# Patient Record
Sex: Male | Born: 1950
Health system: Southern US, Community
[De-identification: ages and names within clinical notes are randomized; demographics above are authoritative.]

## PROBLEM LIST (undated history)

## (undated) DIAGNOSIS — R519 Headache, unspecified: Secondary | ICD-10-CM

## (undated) DIAGNOSIS — F32A Depression, unspecified: Secondary | ICD-10-CM

## (undated) DIAGNOSIS — K219 Gastro-esophageal reflux disease without esophagitis: Secondary | ICD-10-CM

## (undated) DIAGNOSIS — I10 Essential (primary) hypertension: Secondary | ICD-10-CM

## (undated) DIAGNOSIS — N200 Calculus of kidney: Secondary | ICD-10-CM

## (undated) DIAGNOSIS — E119 Type 2 diabetes mellitus without complications: Secondary | ICD-10-CM

## (undated) DIAGNOSIS — F329 Major depressive disorder, single episode, unspecified: Secondary | ICD-10-CM

## (undated) DIAGNOSIS — R51 Headache: Secondary | ICD-10-CM

## (undated) DIAGNOSIS — F419 Anxiety disorder, unspecified: Secondary | ICD-10-CM

## (undated) HISTORY — PX: TONSILLECTOMY: SUR1361

## (undated) HISTORY — DX: Gastro-esophageal reflux disease without esophagitis: K21.9

## (undated) HISTORY — PX: STONE EXTRACTION WITH BASKET: SHX5318

## (undated) HISTORY — PX: COLONOSCOPY: SHX174

## (undated) HISTORY — PX: EXTRACORPOREAL SHOCK WAVE LITHOTRIPSY: SHX1557

---
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
## 2003-06-01 ENCOUNTER — Ambulatory Visit (HOSPITAL_COMMUNITY): Admission: RE | Admit: 2003-06-01 | Discharge: 2003-06-01 | Payer: Self-pay | Admitting: Urology

## 2008-09-02 ENCOUNTER — Observation Stay (HOSPITAL_COMMUNITY): Admission: EM | Admit: 2008-09-02 | Discharge: 2008-09-03 | Payer: Self-pay | Admitting: Emergency Medicine

## 2008-10-07 ENCOUNTER — Ambulatory Visit (HOSPITAL_COMMUNITY): Admission: RE | Admit: 2008-10-07 | Discharge: 2008-10-07 | Payer: Self-pay | Admitting: Urology

## 2010-09-22 LAB — BASIC METABOLIC PANEL
BUN: 22 mg/dL (ref 6–23)
CO2: 28 mEq/L (ref 19–32)
Calcium: 9.1 mg/dL (ref 8.4–10.5)
Chloride: 105 mEq/L (ref 96–112)
Creatinine, Ser: 1.43 mg/dL (ref 0.4–1.5)
GFR calc Af Amer: >60 mL/min (ref >60)
GFR calc non Af Amer: 51 mL/min — ABNORMAL LOW (ref >60)
Glucose, Bld: 150 mg/dL — ABNORMAL HIGH (ref 70–99)
Potassium: 3.2 mEq/L — ABNORMAL LOW (ref 3.5–5.1)
Sodium: 143 mEq/L (ref 135–145)

## 2010-09-22 LAB — URINALYSIS, ROUTINE W REFLEX MICROSCOPIC
Bilirubin Urine: NEGATIVE
Glucose, UA: NEGATIVE mg/dL
Ketones, ur: NEGATIVE mg/dL
Leukocytes, UA: NEGATIVE
Nitrite: NEGATIVE
Protein, ur: 30 mg/dL — AB
Specific Gravity, Urine: 1.029 (ref 1.005–1.030)
Urobilinogen, UA: 0.2 mg/dL (ref 0.0–1.0)
pH: 5.5 (ref 5.0–8.0)

## 2010-09-22 LAB — URINE MICROSCOPIC-ADD ON

## 2010-09-22 LAB — CBC
HCT: 45.3 % (ref 39.0–52.0)
Hemoglobin: 15.7 g/dL (ref 13.0–17.0)
MCHC: 34.7 g/dL (ref 30.0–36.0)
MCV: 95.3 fL (ref 78.0–100.0)
Platelets: 193 10*3/uL (ref 150–400)
RBC: 4.75 MIL/uL (ref 4.22–5.81)
RDW: 13.5 % (ref 11.5–15.5)
WBC: 10.1 10*3/uL (ref 4.0–10.5)

## 2010-09-22 LAB — DIFFERENTIAL
Basophils Absolute: 0 10*3/uL (ref 0.0–0.1)
Basophils Relative: 0 % (ref 0–1)
Eosinophils Absolute: 0 10*3/uL (ref 0.0–0.7)
Eosinophils Relative: 0 % (ref 0–5)
Lymphocytes Relative: 14 % (ref 12–46)
Lymphs Abs: 1.4 10*3/uL (ref 0.7–4.0)
Monocytes Absolute: 0.6 10*3/uL (ref 0.1–1.0)
Monocytes Relative: 6 % (ref 3–12)
Neutro Abs: 8 10*3/uL — ABNORMAL HIGH (ref 1.7–7.7)
Neutrophils Relative %: 79 % — ABNORMAL HIGH (ref 43–77)

## 2010-10-25 NOTE — Op Note (Signed)
[name], Reginald Adkins                ACCOUNT NO.:  [id number]   MEDICAL RECORD NO.:  [id number]          PATIENT TYPE:  OBV   LOCATION:  1438                         FACILITY:  [facility]   PHYSICIAN:  Sigmund I. Patsi Sears, M.D.DATE OF BIRTH:  [birth year]   DATE OF PROCEDURE:  09/02/2008  DATE OF DISCHARGE:                               OPERATIVE REPORT   PREOPERATIVE DIAGNOSIS:  Impacted left lower ureteral calculus.   POSTOPERATIVE DIAGNOSIS:  Impacted left lower ureteral calculus.   OPERATIONS:  Cystourethroscopy, left retrograde pyelogram with  interpretation, left ureteroscopy, laser fractionation of left ureteral  calculus, basket extraction of left lower ureteral stone, left double-J  stent (6-French x 24 cm length double-J stent).   SURGEON:  Dr. Patsi Sears   ANESTHESIA:  Spinal.   PREPARATION:  After appropriate preanesthesia, the patient was brought  to the operating room in good condition.  He was placed on the operating  room in the right lateral decubitus position where a spinal anesthetic  was introduced.  He was then replaced in dorsal lithotomy position where  the pubis was prepped with Betadine solution and draped in the usual  fashion.   BRIEF HISTORY:  This 57-year male was evaluated in the emergency room 12  hours ago, for severe left flank and left lower quadrant pain, found to  have impacted stone in the left lower ureter, as well as bilateral  stones.  There was also an abnormality of the left kidney which will  eventually need to be evaluated once the ureter is stone free.  The  patient is now for basket extraction of stone.   DESCRIPTION OF PROCEDURE:  Cystoscopy was accomplished and showed edema  of left ureteral orifice, and a mass effect of the intramural ureter.  Retrograde pyelogram was performed which showed a large stone in the  intramural ureter.  Ureteroscopy was accomplished, and a multifaceted  stone was identified.  Using the 200 laser fiber, the  stone was  fractioned into three parts, and each part was basket extracted.  Following this, a 6-French x 24 cm double-J catheter was placed in the  renal pelvis, and in the bladder.  The string was left on the end of the  double-J stent for ease of removal.  The patient was then given IV  Toradol, and taken to the recovery room in good condition.  signed 09/03/08 @ 4pm      Sigmund I. Patsi Sears, M.D.  Electronically Signed     SIT/MEDQ  D:  09/02/2008  T:  09/02/2008  Job:  [id number]

## 2012-03-18 ENCOUNTER — Telehealth: Payer: Self-pay | Admitting: Internal Medicine

## 2012-03-18 ENCOUNTER — Ambulatory Visit (INDEPENDENT_AMBULATORY_CARE_PROVIDER_SITE_OTHER): Payer: Self-pay | Admitting: Internal Medicine

## 2012-03-18 ENCOUNTER — Encounter: Payer: Self-pay | Admitting: Internal Medicine

## 2012-03-18 VITALS — BP 156/100 | HR 100 | Temp 99.4°F | Ht 70.0 in | Wt 209.0 lb

## 2012-03-18 DIAGNOSIS — E119 Type 2 diabetes mellitus without complications: Secondary | ICD-10-CM

## 2012-03-18 DIAGNOSIS — I10 Essential (primary) hypertension: Secondary | ICD-10-CM

## 2012-03-18 DIAGNOSIS — E1165 Type 2 diabetes mellitus with hyperglycemia: Secondary | ICD-10-CM

## 2012-03-18 DIAGNOSIS — Z87442 Personal history of urinary calculi: Secondary | ICD-10-CM

## 2012-03-18 LAB — COMPREHENSIVE METABOLIC PANEL
ALT: 96 U/L — ABNORMAL HIGH (ref 0–53)
AST: 97 U/L — ABNORMAL HIGH (ref 0–37)
Albumin: 4.3 g/dL (ref 3.5–5.2)
Alkaline Phosphatase: 104 U/L (ref 39–117)
BUN: 19 mg/dL (ref 6–23)
CO2: 25 mEq/L (ref 19–32)
Calcium: 9.4 mg/dL (ref 8.4–10.5)
Chloride: 100 mEq/L (ref 96–112)
Creat: 0.92 mg/dL (ref 0.50–1.35)
Glucose, Bld: 363 mg/dL — ABNORMAL HIGH (ref 70–99)
Potassium: 4.4 mEq/L (ref 3.5–5.3)
Sodium: 135 mEq/L (ref 135–145)
Total Bilirubin: 1.1 mg/dL (ref 0.3–1.2)
Total Protein: 7.3 g/dL (ref 6.0–8.3)

## 2012-03-18 LAB — POCT URINALYSIS DIPSTICK
Bilirubin, UA: NEGATIVE
Blood, UA: NEGATIVE
Glucose, UA: >2000
Leukocytes, UA: NEGATIVE
Nitrite, UA: NEGATIVE
Protein, UA: NEGATIVE
Spec Grav, UA: 1.02
Urobilinogen, UA: NEGATIVE
pH, UA: 6

## 2012-03-18 LAB — CBC WITH DIFFERENTIAL/PLATELET
Basophils Absolute: 0.1 10*3/uL (ref 0.0–0.1)
Basophils Relative: 1 % (ref 0–1)
Eosinophils Absolute: 0.1 10*3/uL (ref 0.0–0.7)
Eosinophils Relative: 1 % (ref 0–5)
HCT: 48 % (ref 39.0–52.0)
Hemoglobin: 16.6 g/dL (ref 13.0–17.0)
Lymphocytes Relative: 17 % (ref 12–46)
Lymphs Abs: 1.7 10*3/uL (ref 0.7–4.0)
MCH: 32.8 pg (ref 26.0–34.0)
MCHC: 34.6 g/dL (ref 30.0–36.0)
MCV: 94.9 fL (ref 78.0–100.0)
Monocytes Absolute: 0.6 10*3/uL (ref 0.1–1.0)
Monocytes Relative: 6 % (ref 3–12)
Neutro Abs: 7.4 10*3/uL (ref 1.7–7.7)
Neutrophils Relative %: 75 % (ref 43–77)
Platelets: 241 10*3/uL (ref 150–400)
RBC: 5.06 MIL/uL (ref 4.22–5.81)
RDW: 12.7 % (ref 11.5–15.5)
WBC: 9.9 10*3/uL (ref 4.0–10.5)

## 2012-03-18 LAB — LIPID PANEL
Cholesterol: 190 mg/dL (ref 0–200)
HDL: 31 mg/dL — ABNORMAL LOW (ref >39)
LDL Cholesterol: 127 mg/dL — ABNORMAL HIGH (ref 0–99)
Total CHOL/HDL Ratio: 6.1 Ratio
Triglycerides: 162 mg/dL — ABNORMAL HIGH (ref <150)
VLDL: 32 mg/dL (ref 0–40)

## 2012-03-18 LAB — HEMOGLOBIN A1C
Hgb A1c MFr Bld: 13.9 % — ABNORMAL HIGH (ref <5.7)
Mean Plasma Glucose: 352 mg/dL — ABNORMAL HIGH (ref <117)

## 2012-03-18 NOTE — Progress Notes (Signed)
  Subjective:    Patient ID: Reginald Adkins, male    DOB: April 05, 1951, 61 y.o.   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 Cassell Smiles regarding hyperglycemia. Patient has not been feeling well. Has had nocturia, weakness, and polyuria. He used to be seen at [facility] by Dr. Louanna Raw who has retired. He hasn't been there in 2-3 years. There he was started on metformin for diabetes which worked well for some time. He's also been on benazepril and atenolol for hypertension. History of kidney stones. Has had lithotripsy and left ureteral stent placed in the past. Remote history of GE reflux. Denies smoking or significant alcohol use. History of phimosis which will require surgical correction. He needs to be medically stable before that.  Social history: He is single. Native of [place], [place]. Is accompanied today by a male friend who is supportive and understands diabetes well. Family history: One brother died at age 46 with history of diabetes. Death was due to either to a stroke or an MI. 2 brothers who are borderline diabetics.   Review of Systems weakness, depression, lethargy, polydipsia, polyuria, nocturia     Objective:   Physical Exam  Vitals reviewed. HENT:  Head: Normocephalic and atraumatic.  Right Ear: External ear normal.  Left Ear: External ear normal.  Mouth/Throat: Oropharynx is clear and moist.  Eyes: Conjunctivae normal are normal. Right eye exhibits no discharge. Left eye exhibits no discharge. No scleral icterus.  Neck: Neck supple. No JVD present. No thyromegaly present.  Cardiovascular: Normal rate and normal heart sounds.   No murmur heard. Pulmonary/Chest: Effort normal and breath sounds normal. He has no wheezes. He has no rales.  Abdominal: Soft. Bowel sounds are normal. He exhibits no distension and no mass. There is no tenderness. There is no rebound and no guarding.  Genitourinary:       Deferred to Dr.  Cassell Smiles  Musculoskeletal: He exhibits no edema.  Lymphadenopathy:    He has no cervical adenopathy.  Neurological: He is alert. He has normal reflexes. No cranial nerve deficit. Coordination normal.  Skin: Skin is warm and dry.  Psychiatric: His behavior is normal. Judgment and thought content normal.       Dysphoric          Assessment & Plan:  Diabetes mellitus- out of control  Ketonuria  Hypertension-not well controlled  Plan: NovoLog 7 units subcutaneous in office today. Is to buy glucose monitor today and check Accu-Cheks a.c. and at bedtime. Family friend who is a diabetic will help him with this. He is to return tomorrow for followup. Lab work is drawn and pending including serum acetone. Needs to eat regularly. Reviewed with him diabetic diet. Avoid sugar-containing drinks, too much fruit, too many starches. He will take 5 units Levemir  at bedtime tonight. Return tomorrow for followup.  Addendum: Accu-Chek 2 hours after eating lunch was slightly over 400. Patient had been given 7 units of NovoLog in the office this morning. Now Accu-Chek is 3 336. Patient is advised take 10 units of NovoLog nail. He is to take 5 units of Levemir flex pain at bedtime. Is to call us with a.m. Accu-Chek tomorrow morning. Serum acetone is negative.

## 2012-03-18 NOTE — Patient Instructions (Addendum)
Purchase Accu-Chek machine and check Accu-Cheks before meals and at bedtime. Return tomorrow for followup. Take 5 units Levimir at bedtime tonight.

## 2012-03-18 NOTE — Telephone Encounter (Signed)
Do accucheck now.

## 2012-03-19 ENCOUNTER — Ambulatory Visit (INDEPENDENT_AMBULATORY_CARE_PROVIDER_SITE_OTHER): Payer: Self-pay | Admitting: Internal Medicine

## 2012-03-19 ENCOUNTER — Encounter: Payer: Self-pay | Admitting: Internal Medicine

## 2012-03-19 VITALS — BP 170/108 | HR 92 | Wt 209.0 lb

## 2012-03-19 DIAGNOSIS — I10 Essential (primary) hypertension: Secondary | ICD-10-CM

## 2012-03-19 DIAGNOSIS — E1165 Type 2 diabetes mellitus with hyperglycemia: Secondary | ICD-10-CM

## 2012-03-19 DIAGNOSIS — E119 Type 2 diabetes mellitus without complications: Secondary | ICD-10-CM

## 2012-03-19 LAB — MICROALBUMIN, URINE: Microalb, Ur: 5.83 mg/dL — ABNORMAL HIGH (ref 0.00–1.89)

## 2012-03-19 LAB — KETONES, QUALITATIVE: Acetone, Bld: NEGATIVE

## 2012-03-19 NOTE — Telephone Encounter (Signed)
10/8 @ 0950..called patient to check in.  Pt advised that he slept the best last night that he has slept in a VERY long time.  Went to bed at 2030 and got up at 0130 to use the restroom.  He then slept until 0600.  On the evening of 10/7, he took 10 units of Novalog and ate dinner around 6p.  He checked sugar 2 hours later and it was 503.  He then used the Levemir pen that Dr. Lenord Fellers provided at 10/7 visit and he went to bed.  When he awoke at 0600, before breakfast, it was 299.  He ate breakfast around 0800, checked it at 0955 and it was 327.  Message relayed to Dr. Lenord Fellers.  Per Dr. Lenord Fellers, pt to take 10 units of Novalog.  10/8 @ 1110..Called patient to advise to take 10 units of Novalog NOW.  Continue to monitor glucose readings and we'll see him in the office at 1600 today.  Pt advised that Tamela Oddi (his friend) will come with him.  Dr. Lenord Fellers spoke with drug rep to get patient handouts for education for this patient as well.  Patient verbalized understanding of the orders given.

## 2012-03-19 NOTE — Progress Notes (Signed)
  Subjective:    Patient ID: [name], male    DOB: May 19, 1951, 61 y.o.   MRN: [id number]  HPI  Follow up  from yesterday's initial visit with new onset diabetes mellitus. Serum acetone was negative. Electrolytes, BUN and creatinine were within normal limits. Had elevated SGOT and SGPT. Was able to get his labs handled by  [place] due to indigent status. Accu-Chek this afternoon is 239. Blood pressure remains elevated. Today it is 170/108. He feels better and had less nocturia last evening. Good deal of time spent doing diabetic education today and yesterday. Patient says he's had influenza vaccine done if drugstore recently. He's checking Accu-Cheks a.c. and at bedtime. Accu-Cheks yesterday: at 5 PM 336, 8:27 PM 503. This morning Accu-Chek 296 at 6 AM, 363 at 1: 45 p.m.    Review of Systems     Objective:   Physical Exam Alert and oriented x3. Skin warm and dry. Chest clear. Cardiac exam regular rate and rhythm normal S1 and S2. Diabetic foot exam: No ulcers. Pulses are normal in feet. No calluses.       Assessment & Plan:  Diabetes mellitus out of control  Hypertension  History of kidney stones  History of phimosis  Plan: Return October 11 for followup. He is on Norvasc 5 mg daily started yesterday for hypertension. Atenolol has been discontinued with his diabetes it may not be a good idea to have him on a beta blocker. May well need to restart benazepril. Increase Levemir to 10 units at bedtime. Samples of NovoLog for sliding scale coverage as follows:  Accu-Chek less than 200-no coverage  Accu-Chek 200-250 -take 5 units NovoLog  Accu-Chek 251-300-take 7 units NovoLog  Accu-Chek 300-350- take 10 units NovoLog  Accu-Chek greater than 350- take 12 units NovoLog  Levemir being increased to 10 units at bedtime.

## 2012-03-20 ENCOUNTER — Telehealth: Payer: Self-pay | Admitting: Internal Medicine

## 2012-03-20 NOTE — Telephone Encounter (Signed)
Pt advised to continue to monitor and bring that information to his appointment on Friday.  Pt understands that he is to call us should his levels go way high or way low for any reason.  Pt states he will also rely on Tamela Oddi (his friend) to assist him during non-office hours.  Pt is very appreciative and voices understanding of instructions.

## 2012-03-20 NOTE — Telephone Encounter (Signed)
Sp w/Dr. Lenord Fellers and made her aware of this info.  Pt will continue to monitor and call us back w/results before 1 today.

## 2012-03-20 NOTE — Telephone Encounter (Signed)
Before breakfast, his level was 288.  He took the 5 units of Novalog.  He'll continue monitoring and call me back before 1pm today with additional results.  Pt seems to be getting a little more calm as he calls with information.  Still anxious, but it seems to be leveling out a little more and he is beginning to read the sliding scale and understand somewhat what is asked of him.

## 2012-03-22 ENCOUNTER — Ambulatory Visit (INDEPENDENT_AMBULATORY_CARE_PROVIDER_SITE_OTHER): Payer: Self-pay | Admitting: Internal Medicine

## 2012-03-22 ENCOUNTER — Encounter: Payer: Self-pay | Admitting: Internal Medicine

## 2012-03-22 VITALS — BP 158/104 | HR 100 | Temp 99.3°F | Wt 208.0 lb

## 2012-03-22 DIAGNOSIS — E119 Type 2 diabetes mellitus without complications: Secondary | ICD-10-CM

## 2012-03-22 DIAGNOSIS — I10 Essential (primary) hypertension: Secondary | ICD-10-CM

## 2012-03-22 DIAGNOSIS — E1165 Type 2 diabetes mellitus with hyperglycemia: Secondary | ICD-10-CM

## 2012-03-22 NOTE — Progress Notes (Signed)
  Subjective:    Patient ID: Reginald Adkins, male    DOB: May 10, 1951, 62 y.o.   MRN: [id number]  HPI In today to followup on hypertension and poorly controlled diabetes mellitus. He's now on Levemir flexpen 10 units  at bedtime and NovoLog sliding scale. Yesterday at one point he is Accu-Chek was 178 which is quite good for him given that it was 283 and 400 on Monday, October 7.    Review of Systems     Objective:   Physical Exam chest clear to auscultation; cardiac exam regular rate and rhythm; extremities without edema        Assessment & Plan:  Hypertension-currently on Norvasc 5 mg daily. If I increase Norvasc to 10 mg daily he is likely to get dependent edema. Previously was on benazepril 40 mg daily. Refill benazepril 40 mg (#30 ) with 3 refills 1 by mouth daily and continue Norvasc 5 mg daily. Return in 2 weeks  Diabetes mellitus-he's asking if he will always need insulin. At this point I think it is entirely possible he will continue to need insulin because he was not controlled on metformin and he was having extreme symptoms with polyuria. He is now on a calorie restricted diet. He's going to increase Levemir to 12 units at bedtime and return in 2 weeks. We will not be rechecking hemoglobin A1c for 6-8 weeks. He was told was today. He was also told that his lab work was done complementary by [facility] because of indigent status.  He should be able to get Pneumovax immunization and Influenza immunization at [facility].

## 2012-03-22 NOTE — Patient Instructions (Addendum)
Increase Levemir to 12 units at bedtime. Continue sliding scale insulin with NovoLog. Return in 2 weeks. Start benazepril 40 mg daily and continue Norvasc 5 mg daily

## 2012-04-05 ENCOUNTER — Ambulatory Visit (INDEPENDENT_AMBULATORY_CARE_PROVIDER_SITE_OTHER): Payer: Self-pay | Admitting: Internal Medicine

## 2012-04-05 ENCOUNTER — Encounter: Payer: Self-pay | Admitting: Internal Medicine

## 2012-04-05 VITALS — BP 150/90 | HR 96 | Temp 98.8°F | Ht 70.0 in | Wt 200.0 lb

## 2012-04-05 DIAGNOSIS — E119 Type 2 diabetes mellitus without complications: Secondary | ICD-10-CM

## 2012-04-05 DIAGNOSIS — I10 Essential (primary) hypertension: Secondary | ICD-10-CM

## 2012-04-05 DIAGNOSIS — Z794 Long term (current) use of insulin: Secondary | ICD-10-CM

## 2012-04-05 MED ORDER — AMLODIPINE BESYLATE 5 MG PO TABS: 5.0000 mg | ORAL_TABLET | Freq: Every day | ORAL | Status: DC

## 2012-04-05 NOTE — Patient Instructions (Addendum)
NovoLog as been discontinued. Levemir  samples provided. Decreased Levemir to 10 units at bedtime. Refill Norvasc 5 mg daily. Patient to watch blood pressure outside of the doctor's office and call if persistently elevated. Otherwise return in 2 months. At that time he'll have hemoglobin A1c

## 2012-04-05 NOTE — Progress Notes (Signed)
  Subjective:    Patient ID: Reginald Adkins, male    DOB: 24-Nov-1950, 61 y.o.   MRN: [id number]  HPI In today for follow up and diabetes and hypertension. Blood pressure is a bit high today. He thinks he has an element of office hypertension. Says he will have blood pressure checked outside the office. Have refilled amlodipine 5 mg daily. Had considered increasing amlodipine to 10 mg daily or adding a diuretic. Will wait and see how blood pressure does have the next few weeks. His glucose control was come down nicely. He is no longer requiring sliding scale NovoLog. He is on Levemir 12 units daily. At times fasting Accu-Chek is his low was in the 70s. I think he might be able to cut back on Levemir to 10 units at bedtime. He is pleased with his progress. He is eating better in following a diabetic diet with help from his lady friend.    Review of Systems     Objective:   Physical Exam neck is supple; chest clear; cardiac exam regular rate and rhythm; extremities without edema. diabetic foot exam is negative.        Assessment & Plan:  Insulin-dependent diabetes-patient will like to get back on oral medications but this may not be possible since he had very high Accu-Cheks and diabetes out of control which came down nicely with NovoLog and Levemir. We'll have to see what time to get back on oral medications.  Hypertension-we will have to monitor closely. Patient will have blood pressure checked outside my office to see if control is better. In the meantime continue with benazepril and amlodipine 5 mg daily  Plan: Return in 2 months at which time she'll need office visit hemoglobin A1c

## 2012-04-06 ENCOUNTER — Encounter: Payer: Self-pay | Admitting: Internal Medicine

## 2012-04-06 NOTE — Patient Instructions (Addendum)
Follow sliding scale as directed with NovoLog. Increase Levemir to 10 units at bedtime. Return October 11. Continue Norvasc 5 mg daily for hypertension

## 2012-04-18 ENCOUNTER — Telehealth: Payer: Self-pay | Admitting: Internal Medicine

## 2012-04-19 NOTE — Telephone Encounter (Signed)
Patient dropped off his "record" keeping of his glucose readings and his latest BP reading as of this a.m.  Left for Dr. Lenord Fellers to review.  Pt has appt already scheduled for follow up.

## 2012-06-10 ENCOUNTER — Ambulatory Visit (INDEPENDENT_AMBULATORY_CARE_PROVIDER_SITE_OTHER): Payer: Self-pay | Admitting: Internal Medicine

## 2012-06-10 ENCOUNTER — Encounter: Payer: Self-pay | Admitting: Internal Medicine

## 2012-06-10 VITALS — BP 156/90 | HR 88 | Temp 99.8°F | Ht 70.0 in | Wt 180.0 lb

## 2012-06-10 DIAGNOSIS — I10 Essential (primary) hypertension: Secondary | ICD-10-CM

## 2012-06-10 DIAGNOSIS — E119 Type 2 diabetes mellitus without complications: Secondary | ICD-10-CM

## 2012-06-10 LAB — HEMOGLOBIN A1C
Hgb A1c MFr Bld: 5.8 % — ABNORMAL HIGH (ref <5.7)
Mean Plasma Glucose: 120 mg/dL — ABNORMAL HIGH (ref <117)

## 2012-06-10 NOTE — Progress Notes (Signed)
  Subjective:    Patient ID: Reginald Adkins, male    DOB: 1950/07/29, 61 y.o.   MRN: [id number]  HPI In for recheck Diabetes mellitus. He is on Levemir 10 units at bedtime. His Accu-Cheks are excellent. They're mostly around the low 100s. His blood pressure is elevated however. He is on Norvasc 5 mg daily and benazepril 40 mg daily. Says he is a bit upset because someone gave away some of his chickens yesterday without his permission. He's abutting carbohydrates and sugar. He's lost 29 pounds since early October. He feels well. Has energy and is less depressed.    Review of Systems     Objective:   Physical Exam Chest clear to auscultation. Cardiac exam regular rate and rhythm. Extremities without edema. Diabetic foot exam no ulcers or calluses.        Assessment & Plan:  Type 2 diabetes mellitus-well controlled-discontinue Levemir & try Januvia samples 100 mg daily. Decrease Accu-Cheks to twice daily before breakfast and supper  Hypertension-likes the blood pressure under  better control  Plan: Discontinue benazepril. Try Cozaar 100 mg daily. Continue Norvasc 5 mg daily. Call with blood pressure readings in 2 weeks. Hemoglobin A1c drawn today.  Return in 4 months for followup of diabetes and hypertension

## 2012-06-10 NOTE — Patient Instructions (Addendum)
Discontinue Levemir. Tried Janumet 100 mg daily. Samples of them provided. Discontinue benazepril. Continue Norvasc 5 mg daily. Start Cozaar 100 mg daily and call with blood pressure checks in 2 weeks. Otherwise return in 4 months.

## 2012-06-20 ENCOUNTER — Telehealth: Payer: Self-pay | Admitting: Internal Medicine

## 2012-06-20 NOTE — Telephone Encounter (Signed)
Spoke w/Carol @[facility] [address]) [phone]; Metformin 500 mg 1 po bid #60 with 3 refills.  Patient is aware of this and will continue to provide updates of his glucose readings.

## 2012-10-08 ENCOUNTER — Other Ambulatory Visit: Payer: Self-pay | Admitting: Internal Medicine

## 2012-10-08 ENCOUNTER — Other Ambulatory Visit: Payer: Self-pay

## 2012-10-08 DIAGNOSIS — E119 Type 2 diabetes mellitus without complications: Secondary | ICD-10-CM

## 2012-10-08 LAB — HEMOGLOBIN A1C
Hgb A1c MFr Bld: 5.3 % (ref <5.7)
Mean Plasma Glucose: 105 mg/dL (ref <117)

## 2012-10-08 MED ORDER — AMLODIPINE BESYLATE 5 MG PO TABS: 5.0000 mg | ORAL_TABLET | Freq: Every day | ORAL | Status: DC

## 2012-10-09 ENCOUNTER — Other Ambulatory Visit: Payer: Self-pay | Admitting: Internal Medicine

## 2012-10-09 ENCOUNTER — Other Ambulatory Visit: Payer: Self-pay

## 2012-10-09 MED ORDER — LOSARTAN POTASSIUM 100 MG PO TABS: 100.0000 mg | ORAL_TABLET | Freq: Every day | ORAL | Status: DC

## 2012-10-10 NOTE — Progress Notes (Signed)
Left message with lab results on cell phone. Has appointment in 3 weeks.

## 2012-10-31 ENCOUNTER — Ambulatory Visit (INDEPENDENT_AMBULATORY_CARE_PROVIDER_SITE_OTHER): Payer: Self-pay | Admitting: Internal Medicine

## 2012-10-31 ENCOUNTER — Encounter: Payer: Self-pay | Admitting: Internal Medicine

## 2012-10-31 VITALS — BP 168/94 | HR 80 | Ht 70.0 in | Wt 159.0 lb

## 2012-10-31 DIAGNOSIS — E119 Type 2 diabetes mellitus without complications: Secondary | ICD-10-CM

## 2012-10-31 MED ORDER — HYDROCHLOROTHIAZIDE 25 MG PO TABS: 25.0000 mg | ORAL_TABLET | Freq: Every day | ORAL | Status: DC

## 2012-10-31 MED ORDER — METFORMIN HCL 500 MG PO TABS: 500.0000 mg | ORAL_TABLET | Freq: Two times a day (BID) | ORAL | Status: DC

## 2012-10-31 NOTE — Progress Notes (Signed)
  Subjective:    Patient ID: Reginald Adkins, male    DOB: 04/29/1951, 62 y.o.   MRN: [id number]  HPI 62 year old white male in today for blood pressure and diabetes check. He has continued to lose weight by watching his diet. He is on metformin 500 mg twice daily and recent hemoglobin A1c was normal. His weight today is 159 pounds and previously in December 1 180 pounds. I do not think he should lose any more weight. Encouraged him perhaps a just a bit more. I don't think he is probably getting enough calories. He continues to do yard work. Blood pressure is high today. Says he is worried about his brother who has been diagnosed with recurrent kidney cancer. This is upsetting for him. However he is on losartan 100 mg daily and amlodipine. I'm going to add HCTZ 25 mg to this regimen today and have him return in 4 weeks for nurse visit, blood pressure check and basic metabolic panel.    Review of Systems     Objective:   Physical Exam Neck is supple without JVD thyromegaly or carotid bruits. Chest clear to auscultation. Cardiac exam regular rate and rhythm normal S1-S2. Extremities without edema. Diabetic foot exam without ulcers or calluses       Assessment & Plan:  Hypertension  Type 2 diabetes mellitus-controlled with medication  Anxiety-related to brothers illness  Plan: Add HCTZ 25 mg daily to losartan and amlodipine. Return in 4 weeks for nurse visit, blood pressure check and basic metabolic panel. He did not qualify for Medicaid unfortunately. He thinking of taking early retirement at age 62 to get Medicare.

## 2012-10-31 NOTE — Patient Instructions (Addendum)
Do not lose any more weight. Add HCTZ 25 mg daily to Losartan. Return in 4 weeks for BP check and basic metabolic panel.

## 2012-11-06 DIAGNOSIS — E119 Type 2 diabetes mellitus without complications: Secondary | ICD-10-CM | POA: Insufficient documentation

## 2012-12-02 ENCOUNTER — Encounter: Payer: Self-pay | Admitting: Internal Medicine

## 2012-12-02 ENCOUNTER — Ambulatory Visit (INDEPENDENT_AMBULATORY_CARE_PROVIDER_SITE_OTHER): Payer: Self-pay | Admitting: Internal Medicine

## 2012-12-02 VITALS — BP 155/84

## 2012-12-02 DIAGNOSIS — I10 Essential (primary) hypertension: Secondary | ICD-10-CM

## 2012-12-02 LAB — BASIC METABOLIC PANEL
BUN: 22 mg/dL (ref 6–23)
CO2: 29 mEq/L (ref 19–32)
Calcium: 9.3 mg/dL (ref 8.4–10.5)
Chloride: 103 mEq/L (ref 96–112)
Creat: 0.85 mg/dL (ref 0.50–1.35)
Glucose, Bld: 132 mg/dL — ABNORMAL HIGH (ref 70–99)
Potassium: 4 mEq/L (ref 3.5–5.3)
Sodium: 142 mEq/L (ref 135–145)

## 2012-12-02 NOTE — Patient Instructions (Addendum)
Combine Losartan and HCTZ into one tablet and return in 3 months. Patient informed. Appointment made for 03/06/2013.

## 2012-12-02 NOTE — Progress Notes (Signed)
  Subjective:    Patient ID: Reginald Adkins, male    DOB: 11-27-1950, 62 y.o.   MRN: [id number]  HPI Patient seen by nurse today for blood pressure check. Blood pressure elevated at 155 systolically. Had recently been placed on losartan 100 mg daily and HCTZ 25 mg daily. This can be combined it to one pill. Says he is getting excellent blood pressure readings at home. Basic metabolic panel drawn today since he is on an ACE inhibitor and HCTZ.    Review of Systems     Objective:   Physical Exam not examined        Assessment & Plan:  Hypertension  Type 2 diabetes mellitus under good control  Plan: He may have an element of office hypertension. He is to continue to keep blood pressure readings at home. Combined losartan with HCTZ 100/25 one tablet daily and return in 3 months

## 2012-12-03 NOTE — Progress Notes (Signed)
Patient informed. Will call back when he is almost out of Losartan so we may change it to Losartan-HCTZ. Will get BP checked at various places and keep [place] posted.

## 2013-01-24 ENCOUNTER — Other Ambulatory Visit: Payer: Self-pay | Admitting: Internal Medicine

## 2013-03-06 ENCOUNTER — Encounter: Payer: Self-pay | Admitting: Internal Medicine

## 2013-03-06 ENCOUNTER — Ambulatory Visit (INDEPENDENT_AMBULATORY_CARE_PROVIDER_SITE_OTHER): Payer: Self-pay | Admitting: Internal Medicine

## 2013-03-06 VITALS — BP 130/80 | HR 80 | Wt 162.0 lb

## 2013-03-06 DIAGNOSIS — E119 Type 2 diabetes mellitus without complications: Secondary | ICD-10-CM

## 2013-03-06 DIAGNOSIS — I10 Essential (primary) hypertension: Secondary | ICD-10-CM

## 2013-03-06 LAB — HEMOGLOBIN A1C
Hgb A1c MFr Bld: 5.6 % (ref <5.7)
Mean Plasma Glucose: 114 mg/dL (ref <117)

## 2013-03-06 NOTE — Addendum Note (Signed)
Addended by: Judy Pimple on: 03/06/2013 12:41 PM   Modules accepted: Orders

## 2013-03-06 NOTE — Progress Notes (Addendum)
  Subjective:    Patient ID: Reginald Adkins, male    DOB: February 26, 1951, 62 y.o.   MRN: [id number]  HPI For follow up of DM and HTN.  Says accuchecks are good. Is on Losartan, amlodipine, and HCTZ. Trying to find low cost health insurance.  [facility] flu vaccine [facility]. Weight is stable.  Thinks he might be eligible for early retirement soon. Qualified for Medicaid but lack of state funding prevented him from getting it he says.    Review of Systems     Objective:   Physical Exam neck supple without carotid bruits. Chest clear to auscultation. Cardiac exam regular rate and rhythm normal S1 and S2. Extremities without edema. Diabetic foot exam without calluses or ulcers        Assessment & Plan:  Controlled type 2 diabetes mellitus  Hypertension-stable on current regimen. Has an element of labile hypertension when he first comes in to doctor's office  Plan: Hemoglobin A1c drawn today. He is self-pay. He [facility] influenza [facility] through [facility] which [facility]. Return in 6 months.

## 2013-03-06 NOTE — Patient Instructions (Addendum)
Continue same medications and return in 6 months. Have flu vaccine at [facility]

## 2013-04-08 ENCOUNTER — Other Ambulatory Visit: Payer: Self-pay | Admitting: Internal Medicine

## 2013-07-04 ENCOUNTER — Other Ambulatory Visit: Payer: Self-pay

## 2013-07-04 MED ORDER — AMLODIPINE BESYLATE 5 MG PO TABS: 5.0000 mg | ORAL_TABLET | Freq: Every day | ORAL | Status: DC

## 2013-09-04 ENCOUNTER — Encounter: Payer: Self-pay | Admitting: Internal Medicine

## 2013-09-04 ENCOUNTER — Ambulatory Visit (INDEPENDENT_AMBULATORY_CARE_PROVIDER_SITE_OTHER): Payer: Self-pay | Admitting: Internal Medicine

## 2013-09-04 VITALS — BP 130/80 | HR 80 | Ht 70.0 in | Wt 160.0 lb

## 2013-09-04 DIAGNOSIS — Z13220 Encounter for screening for lipoid disorders: Secondary | ICD-10-CM

## 2013-09-04 DIAGNOSIS — Z130 Encounter for screening for diseases of the blood and blood-forming organs and certain disorders involving the immune mechanism: Secondary | ICD-10-CM

## 2013-09-04 DIAGNOSIS — I10 Essential (primary) hypertension: Secondary | ICD-10-CM

## 2013-09-04 DIAGNOSIS — Z125 Encounter for screening for malignant neoplasm of prostate: Secondary | ICD-10-CM

## 2013-09-04 DIAGNOSIS — E119 Type 2 diabetes mellitus without complications: Secondary | ICD-10-CM

## 2013-09-04 LAB — CBC WITH DIFFERENTIAL/PLATELET
Basophils Absolute: 0.1 10*3/uL (ref 0.0–0.1)
Basophils Relative: 1 % (ref 0–1)
Eosinophils Absolute: 0.1 10*3/uL (ref 0.0–0.7)
Eosinophils Relative: 2 % (ref 0–5)
HCT: 42.4 % (ref 39.0–52.0)
Hemoglobin: 14.8 g/dL (ref 13.0–17.0)
Lymphocytes Relative: 20 % (ref 12–46)
Lymphs Abs: 1.3 10*3/uL (ref 0.7–4.0)
MCH: 31 pg (ref 26.0–34.0)
MCHC: 34.9 g/dL (ref 30.0–36.0)
MCV: 88.7 fL (ref 78.0–100.0)
Monocytes Absolute: 0.7 10*3/uL (ref 0.1–1.0)
Monocytes Relative: 10 % (ref 3–12)
Neutro Abs: 4.4 10*3/uL (ref 1.7–7.7)
Neutrophils Relative %: 67 % (ref 43–77)
Platelets: 300 10*3/uL (ref 150–400)
RBC: 4.78 MIL/uL (ref 4.22–5.81)
RDW: 12.7 % (ref 11.5–15.5)
WBC: 6.6 10*3/uL (ref 4.0–10.5)

## 2013-09-04 LAB — COMPREHENSIVE METABOLIC PANEL
ALT: 26 U/L (ref 0–53)
AST: 24 U/L (ref 0–37)
Albumin: 4.3 g/dL (ref 3.5–5.2)
Alkaline Phosphatase: 50 U/L (ref 39–117)
BUN: 20 mg/dL (ref 6–23)
CO2: 28 mEq/L (ref 19–32)
Calcium: 9.1 mg/dL (ref 8.4–10.5)
Chloride: 102 mEq/L (ref 96–112)
Creat: 0.86 mg/dL (ref 0.50–1.35)
Glucose, Bld: 106 mg/dL — ABNORMAL HIGH (ref 70–99)
Potassium: 3.9 mEq/L (ref 3.5–5.3)
Sodium: 140 mEq/L (ref 135–145)
Total Bilirubin: 0.9 mg/dL (ref 0.2–1.2)
Total Protein: 6.4 g/dL (ref 6.0–8.3)

## 2013-09-04 LAB — LIPID PANEL
Cholesterol: 93 mg/dL (ref 0–200)
HDL: 27 mg/dL — ABNORMAL LOW (ref >39)
LDL Cholesterol: 59 mg/dL (ref 0–99)
Total CHOL/HDL Ratio: 3.4 Ratio
Triglycerides: 33 mg/dL (ref <150)
VLDL: 7 mg/dL (ref 0–40)

## 2013-09-04 LAB — HEMOGLOBIN A1C
HEMOGLOBIN A1C: 6 % — AB (ref <5.7)
Mean Plasma Glucose: 126 mg/dL — ABNORMAL HIGH (ref <117)

## 2013-09-04 NOTE — Addendum Note (Signed)
Addended by: Brett Canales on: 09/04/2013 11:59 AM   Modules accepted: Orders

## 2013-09-04 NOTE — Patient Instructions (Signed)
Continue same medications and return in 6 months. Continue diet and exercise.

## 2013-09-04 NOTE — Progress Notes (Signed)
   Subjective:    Patient ID: Reginald Adkins, male    DOB: 03-09-1951, 63 y.o.   MRN: [id number]  HPI 63 year old White Male with history of hypertension and type 2 diabetes mellitus well controlled in today for six-month recheck. He's been unable to receive Medicaid. He's been a bit depressed about his finances. He has seasonal employment doing yard work. He tried to sign up for the Affordable Care Act coverage and was told he had to pay $800 a month for coverage. He cannot afford this.  We are able to get his lab work covered for free through [employer]. [place] will give him a significant discount on his medical services. Told him not to worry about this.  His weight is stable. Blood pressure is acceptable. Last night he had some diarrhea for several hours. Says he ate a salad for lunch, ate an egg late last evening and diarrhea started after midnight. Could have been viral or perhaps staphylococcal food poisoning but seems to have dissipated at present time.    Review of Systems     Objective:   Physical Exam  Neck is supple without JVD thyromegaly or carotid bruits. Chest clear to auscultation. Cardiac exam regular rate and rhythm normal S1 and S2. Extremities without edema. Diabetic foot exam without ulcers.      Assessment & Plan:  Type 2 diabetes mellitus-controlled  Hypertension-controlled on current regimen  History of kidney stones  Situational depression regarding finances  Plan: Return in 6 months for office visit. Labs are pending.

## 2013-09-05 LAB — PSA: PSA: 0.18 ng/mL (ref <=4.00)

## 2013-09-05 LAB — MICROALBUMIN, URINE: MICROALB UR: 1.7 mg/dL (ref 0.00–1.89)

## 2013-09-29 ENCOUNTER — Other Ambulatory Visit: Payer: Self-pay | Admitting: Internal Medicine

## 2013-10-28 ENCOUNTER — Other Ambulatory Visit: Payer: Self-pay | Admitting: Internal Medicine

## 2013-10-30 ENCOUNTER — Other Ambulatory Visit: Payer: Self-pay | Admitting: Internal Medicine

## 2013-12-31 ENCOUNTER — Other Ambulatory Visit: Payer: Self-pay

## 2013-12-31 MED ORDER — AMLODIPINE BESYLATE 5 MG PO TABS: 5.0000 mg | ORAL_TABLET | Freq: Every day | ORAL | Status: DC

## 2014-03-09 ENCOUNTER — Encounter: Payer: Self-pay | Admitting: Internal Medicine

## 2014-03-09 ENCOUNTER — Ambulatory Visit (INDEPENDENT_AMBULATORY_CARE_PROVIDER_SITE_OTHER): Payer: Self-pay | Admitting: Internal Medicine

## 2014-03-09 VITALS — BP 162/88 | HR 62 | Temp 99.0°F | Ht 70.0 in | Wt 171.0 lb

## 2014-03-09 DIAGNOSIS — Z23 Encounter for immunization: Secondary | ICD-10-CM

## 2014-03-09 DIAGNOSIS — I10 Essential (primary) hypertension: Secondary | ICD-10-CM

## 2014-03-09 DIAGNOSIS — E119 Type 2 diabetes mellitus without complications: Secondary | ICD-10-CM

## 2014-03-09 MED ORDER — HYDROCHLOROTHIAZIDE 25 MG PO TABS: 25.0000 mg | ORAL_TABLET | Freq: Every day | ORAL | Status: DC

## 2014-03-09 NOTE — Patient Instructions (Addendum)
Return in 6 months for office visit. Continue same meds. Return in 2 weeks for free blood pressure check. Vaccine given today.

## 2014-03-09 NOTE — Progress Notes (Signed)
   Subjective:    Patient ID: Reginald Adkins, male    DOB: [birth year], 63 y.o.   MRN: [id number]  HPI Patient in today for six-month recheck. Hemoglobin A1c drawn. Blood pressure is elevated. He has had medication this morning but has not eaten in knees hungry. He gets anxious in the office and has labile hypertension. Has gained a little bit of weight around 11 pounds but he has also cowboy boots today. Says Accu-Cheks have been stable. Having some financial difficulties. Work is been slow. Brother is dying from terminal kidney cancer. Needs to have diabetic eye exam and has issues affording medical care.    Review of Systems     Objective:   Physical Exam  Neck is supple without JVD thyromegaly or carotid bruits. Chest clear. Cardiac exam regular rate and rhythm.      Assessment & Plan:  Controlled type 2 diabetes  Hypertension  Office hypertension  Plan: He is to return in 2 weeks for blood pressure check is his blood pressure is elevated today. He will have eaten and  taken all medications before coming. Hemoglobin A1c drawn and pending. Vaccine given.

## 2014-03-10 LAB — HEMOGLOBIN A1C
HEMOGLOBIN A1C: 5.7 % — AB (ref <5.7)
Mean Plasma Glucose: 117 mg/dL — ABNORMAL HIGH (ref <117)

## 2014-03-16 ENCOUNTER — Telehealth: Payer: Self-pay

## 2014-03-16 ENCOUNTER — Encounter: Payer: Self-pay | Admitting: Internal Medicine

## 2014-03-16 ENCOUNTER — Ambulatory Visit (INDEPENDENT_AMBULATORY_CARE_PROVIDER_SITE_OTHER): Payer: Self-pay | Admitting: Internal Medicine

## 2014-03-16 VITALS — BP 158/80 | HR 78 | Temp 98.2°F | Ht 70.0 in | Wt 171.0 lb

## 2014-03-16 DIAGNOSIS — IMO0001 Reserved for inherently not codable concepts without codable children: Secondary | ICD-10-CM

## 2014-03-16 DIAGNOSIS — F418 Other specified anxiety disorders: Secondary | ICD-10-CM

## 2014-03-16 DIAGNOSIS — F329 Major depressive disorder, single episode, unspecified: Secondary | ICD-10-CM | POA: Insufficient documentation

## 2014-03-16 DIAGNOSIS — I10 Essential (primary) hypertension: Secondary | ICD-10-CM

## 2014-03-16 DIAGNOSIS — F419 Anxiety disorder, unspecified: Secondary | ICD-10-CM

## 2014-03-16 DIAGNOSIS — R030 Elevated blood-pressure reading, without diagnosis of hypertension: Secondary | ICD-10-CM

## 2014-03-16 DIAGNOSIS — F32A Depression, unspecified: Secondary | ICD-10-CM | POA: Insufficient documentation

## 2014-03-16 MED ORDER — SERTRALINE HCL 50 MG PO TABS: ORAL_TABLET | ORAL | Status: DC

## 2014-03-16 NOTE — Telephone Encounter (Signed)
Patient thinks he needs something for anxiety or depression.  Please advise.

## 2014-03-16 NOTE — Patient Instructions (Signed)
Begin Zoloft 25 mg daily for 5 days increasing to 50 mg daily. Call in 4-6 weeks with progress report. Monitor blood pressure at home and call us with progress report

## 2014-03-16 NOTE — Telephone Encounter (Signed)
Left message for patient to call office regarding his medication.  Zoloft sent to [place].

## 2014-03-16 NOTE — Progress Notes (Signed)
Patient seen for blood pressure follow up.  He came into the office shook up already just thinking about getting his pressure checked.  Let patient sit in the room quietly and calmly.  Pressure was 158/80.  Patient says he is worrying about everything and anything.  He thinks maybe he needs something for anxiety or depression.  He is taking care of a friend who has memory issues and the patient feels it is taking a toll on himself.  Patient also has been out of work for 2 weeks due to the rain.  Advised patient to find some things that make him happy and focus on that.  He would like to know if he needs an antidepressant or anxiety medication?  Provider aware.

## 2014-03-16 NOTE — Progress Notes (Signed)
   Subjective:    Patient ID: Reginald Adkins, male    DOB: 1951/02/28, 63 y.o.   MRN: [id number]  HPI Here today for repeat blood pressure check. History of office hypertension. Having anxiety depression issues of her brother who is dying of kidney cancer. Thinks he needs something for anxiety and depression. Not seen by physician today. Blood pressure checked by CMA.  BP still  elevated here in office but is fine at home.    Review of Systems     Objective:   Physical Exam        Assessment & Plan:  Office hypertension  Anxiety depression  Plan: Start Zoloft 50 mg daily and call with progress report in 4-6 weeks. Continue to monitor blood pressure at home and call with progress report.

## 2014-03-16 NOTE — Telephone Encounter (Signed)
Patient aware of zoloft rx.  He will call back in 4-6 weeks with an update.

## 2014-03-16 NOTE — Telephone Encounter (Signed)
Begin Zoloft 50 mg daily start with one half tablet x 5 days then increase to whole tab daily. Take hs. Call with progress report in 4-6 weeks. Takes a while to improve.

## 2014-05-21 ENCOUNTER — Other Ambulatory Visit: Payer: Self-pay | Admitting: Internal Medicine

## 2014-06-30 ENCOUNTER — Other Ambulatory Visit: Payer: Self-pay | Admitting: *Deleted

## 2014-06-30 ENCOUNTER — Other Ambulatory Visit: Payer: Self-pay | Admitting: Internal Medicine

## 2014-06-30 MED ORDER — LOSARTAN POTASSIUM 100 MG PO TABS: 100.0000 mg | ORAL_TABLET | Freq: Every day | ORAL | Status: DC

## 2014-06-30 NOTE — Telephone Encounter (Signed)
Refill sent for Losartan 100mg  to [facility] at patient request

## 2014-09-17 ENCOUNTER — Telehealth: Payer: Self-pay | Admitting: Internal Medicine

## 2014-09-17 MED ORDER — LORATADINE 10 MG PO TABS: 10.0000 mg | ORAL_TABLET | Freq: Every day | ORAL | Status: DC

## 2014-09-17 MED ORDER — FLUTICASONE PROPIONATE 50 MCG/ACT NA SUSP
2.0000 | Freq: Every day | NASAL | Status: DC
Start: 2014-09-17 — End: 2017-03-26

## 2014-09-17 NOTE — Telephone Encounter (Addendum)
His allergies are real bad; congestion, etc.  He is working outside but he's wearing a mask.  Wants to know what he can take OTC; Flonase, Claritin??  He wants to make sure that he takes something that doesn't run his sugar up.  His sugars are doing great and continuing to run in the 90's.  He has gained a little weight and seems concerned about that.  He will make an appointment to come in and see you at the first part of May.  He is paying off his truck this month.  He wants to be financially able to pay as he goes with you.    Please advise as to OTC meds that he can take for allergy relief.

## 2014-09-17 NOTE — Telephone Encounter (Signed)
Spoke with patient let him know I sent scripts for claritin and flonase to pharmacy

## 2014-09-17 NOTE — Telephone Encounter (Signed)
Clarint 10 mg daily and Flonase nasal spray daily through June

## 2014-10-26 ENCOUNTER — Telehealth: Payer: Self-pay | Admitting: Internal Medicine

## 2014-10-26 MED ORDER — METFORMIN HCL 500 MG PO TABS: 500.0000 mg | ORAL_TABLET | Freq: Two times a day (BID) | ORAL | Status: DC

## 2014-10-26 NOTE — Telephone Encounter (Signed)
If he is not on Medicare yet then we can defer a check up. He needs appt soon like to see him q 6 months

## 2014-10-26 NOTE — Telephone Encounter (Signed)
Needs refill on his Rx Metformin 500mg .  He takes this bid.  He didn't realize that he only has 2 pills left.  He also wants to know when you would like to see him next.  He says he has gained a little weight but still fits in his size 30 jeans.  Others tell him he looks much better and not as "sickly" as he did.  States he feels just fine.  And, he will come in whenever you feel is best.  Last 6 mo check up was 03/09/14.  You want to see him end of June or July?    Pharmacy:  [facility] in [place] [phone])

## 2014-10-26 NOTE — Telephone Encounter (Signed)
Refilled patient Metformin 90 day supply

## 2014-10-26 NOTE — Telephone Encounter (Signed)
Appointment given for Tuesday, 5/24 @ 9:45.  Also asked patient to be fasting for appointment.  Last fasting labs done 09/02/13.  Last A1C done 03/09/14.   LMOM on patient's cell #.  Just need to call in Metformin for patient now.  Advised on message to patient that we would call his med in to his pharmacy.

## 2014-11-03 ENCOUNTER — Encounter: Payer: Self-pay | Admitting: Internal Medicine

## 2014-11-03 ENCOUNTER — Ambulatory Visit (INDEPENDENT_AMBULATORY_CARE_PROVIDER_SITE_OTHER): Payer: Self-pay | Admitting: Internal Medicine

## 2014-11-03 VITALS — BP 160/84 | HR 74 | Temp 98.0°F | Ht 70.0 in | Wt 175.0 lb

## 2014-11-03 DIAGNOSIS — E119 Type 2 diabetes mellitus without complications: Secondary | ICD-10-CM

## 2014-11-03 LAB — HEMOGLOBIN A1C
Hgb A1c MFr Bld: 5.9 % — ABNORMAL HIGH (ref <5.7)
Mean Plasma Glucose: 123 mg/dL — ABNORMAL HIGH (ref <117)

## 2014-11-03 NOTE — Patient Instructions (Signed)
Continue same meds and return for blood pressure check in 2 weeks. Take all meds before coming to office.

## 2014-11-03 NOTE — Progress Notes (Signed)
   Subjective:    Patient ID: Reginald Adkins, male    DOB: [birth year], 64 y.o.   MRN: [id number]  HPI  In today for follow-up on diabetes and hypertension. Blood pressure is elevated today. Says he did not take Zoloft prior to coming to office. Says he is doing well. Truck is paid off. Says he is disappointed with not being able to get good rate with Affordable Care Act so he remains uninsured paying out of pocket. We try not to charge a great deal for his visits. Is compliant with medication. Has gained 4 pounds since last visit. 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 Says Accu-Cheks have been very good.  Hemoglobin A1c and urine for microalbumin drawn today    Review of Systems     Objective:   Physical Exam  Neck is supple without JVD thyromegaly or carotid bruits. Chest clear to auscultation. Cardiac exam regular rate and rhythm normal S1 and S2. Extremities without edema.      Assessment & Plan:  Control type 2 diabetes without complication  Essential hypertension-BP elevated today but did not take Zoloft prior to coming  History of depression-treated with Zoloft and doing well  Plan: Return in 2 weeks for blood pressure check only. Otherwise return in 6 months.

## 2014-11-04 ENCOUNTER — Telehealth: Payer: Self-pay | Admitting: *Deleted

## 2014-11-04 LAB — MICROALBUMIN / CREATININE URINE RATIO
CREATININE, URINE: 81.7 mg/dL
MICROALB UR: 0.8 mg/dL (ref <2.0)
Microalb Creat Ratio: 9.8 mg/g (ref 0.0–30.0)

## 2014-11-04 NOTE — Telephone Encounter (Signed)
Advised patient that his A1C is excellent per Dr. Renold Genta at 5.9.  She'll see him in 6 months.   Patient is to return 6/9 at 3:45 for BP check (nurse visit only).  We will make 6 month appointment at that time.

## 2014-11-06 ENCOUNTER — Telehealth: Payer: Self-pay | Admitting: *Deleted

## 2014-11-06 NOTE — Telephone Encounter (Signed)
Reviewed lab results with patient.

## 2014-11-15 ENCOUNTER — Other Ambulatory Visit: Payer: Self-pay | Admitting: Internal Medicine

## 2014-11-16 ENCOUNTER — Other Ambulatory Visit: Payer: Self-pay | Admitting: *Deleted

## 2014-11-16 MED ORDER — SERTRALINE HCL 50 MG PO TABS: 50.0000 mg | ORAL_TABLET | Freq: Every day | ORAL | Status: DC

## 2014-11-19 ENCOUNTER — Ambulatory Visit: Payer: Self-pay | Admitting: Internal Medicine

## 2014-11-30 ENCOUNTER — Ambulatory Visit: Payer: Self-pay | Admitting: Internal Medicine

## 2014-12-01 ENCOUNTER — Encounter: Payer: Self-pay | Admitting: Internal Medicine

## 2014-12-01 ENCOUNTER — Ambulatory Visit (INDEPENDENT_AMBULATORY_CARE_PROVIDER_SITE_OTHER): Payer: Self-pay | Admitting: Internal Medicine

## 2014-12-01 VITALS — BP 140/80 | HR 75 | Temp 97.7°F | Wt 180.0 lb

## 2014-12-01 DIAGNOSIS — I10 Essential (primary) hypertension: Secondary | ICD-10-CM

## 2014-12-01 NOTE — Patient Instructions (Signed)
Continue same medications and return in 6 months 

## 2014-12-01 NOTE — Progress Notes (Signed)
   Subjective:    Patient ID: Reginald Adkins, male    DOB: [birth year], 64 y.o.   MRN: [id number]  HPI Back today for blood pressure check. Blood pressure is 140/80. He did not take losartan until about an hour ago. Has taken amlodipine and diuretic today. Feels well with no new complaints. Says Accu-Cheks are running fine.    Review of Systems     Objective:   Physical Exam  Chest clear. Cardiac exam regular rate and rhythm. Extremities without edema.      Assessment & Plan:  Essential hypertension  Plan: Continue same medications and return in 6 months.

## 2015-01-22 ENCOUNTER — Other Ambulatory Visit: Payer: Self-pay | Admitting: Internal Medicine

## 2015-04-19 ENCOUNTER — Other Ambulatory Visit: Payer: Self-pay

## 2015-04-19 ENCOUNTER — Other Ambulatory Visit: Payer: Self-pay | Admitting: Internal Medicine

## 2015-04-19 MED ORDER — METFORMIN HCL 500 MG PO TABS: 500.0000 mg | ORAL_TABLET | Freq: Two times a day (BID) | ORAL | Status: DC

## 2015-06-03 ENCOUNTER — Encounter: Payer: Self-pay | Admitting: Internal Medicine

## 2015-06-03 ENCOUNTER — Ambulatory Visit (INDEPENDENT_AMBULATORY_CARE_PROVIDER_SITE_OTHER): Payer: Self-pay | Admitting: Internal Medicine

## 2015-06-03 VITALS — BP 130/80 | HR 90 | Temp 97.8°F | Resp 20 | Ht 70.0 in | Wt 168.0 lb

## 2015-06-03 DIAGNOSIS — F411 Generalized anxiety disorder: Secondary | ICD-10-CM

## 2015-06-03 DIAGNOSIS — E119 Type 2 diabetes mellitus without complications: Secondary | ICD-10-CM

## 2015-06-03 DIAGNOSIS — I10 Essential (primary) hypertension: Secondary | ICD-10-CM

## 2015-06-03 LAB — BASIC METABOLIC PANEL
BUN: 22 mg/dL (ref 7–25)
CALCIUM: 9.8 mg/dL (ref 8.6–10.3)
CO2: 27 mmol/L (ref 20–31)
Chloride: 99 mmol/L (ref 98–110)
Creat: 0.97 mg/dL (ref 0.70–1.25)
GLUCOSE: 121 mg/dL — AB (ref 65–99)
Potassium: 4 mmol/L (ref 3.5–5.3)
Sodium: 137 mmol/L (ref 135–146)

## 2015-06-03 LAB — LIPID PANEL
CHOL/HDL RATIO: 3.4 ratio (ref <=5.0)
CHOLESTEROL: 171 mg/dL (ref 125–200)
HDL: 51 mg/dL (ref >=40)
LDL CALC: 107 mg/dL (ref <130)
TRIGLYCERIDES: 63 mg/dL (ref <150)
VLDL: 13 mg/dL (ref <30)

## 2015-06-03 NOTE — Progress Notes (Signed)
   Subjective:    Patient ID: Reginald Adkins, male    DOB: [birth year], 64 y.o.   MRN: [id number]  HPI 64 year old White Male with history of diabetes mellitus and hypertension in today for follow-up. Blood pressure was elevated initially on arrival but he is brother is dying of kidney cancer and he's been upset about that. Another brother has Parkinson's disease. When I rechecked his blood pressure, it was 130/80 after resting in the exam room. He has a headache today but has been upset. Says Accu-Cheks and been very stable. Did receive flu vaccine and Zostavax vaccine elsewhere.    Review of Systems     Objective:   Physical Exam Skin warm and dry. Nodes none. Neck is supple without JVD thyromegaly or carotid bruits. Chest clear to auscultation without rales or wheezing. Cardiac exam regular rate and rhythm normal S1 and S2. Extremities without edema. Diabetic foot exam is negative.       Assessment & Plan:  Controlled type 2 diabetes mellitus-hemoglobin A1c drawn and pending  Essential hypertension-stable  Anxiety related to situational stress with brother  Plan: Continue same medications and return in 6 months for six-month recheck. He'll be 65 in October and can qualify for Medicare and will get Welcome to Medicare exam after that.

## 2015-06-04 LAB — HEMOGLOBIN A1C
Hgb A1c MFr Bld: 6.1 % — ABNORMAL HIGH (ref <5.7)
Mean Plasma Glucose: 128 mg/dL — ABNORMAL HIGH (ref <117)

## 2015-06-09 NOTE — Patient Instructions (Signed)
Continue same meds and return in 6 months. It was a pleasure to see you today.

## 2015-06-25 ENCOUNTER — Other Ambulatory Visit: Payer: Self-pay | Admitting: Urology

## 2015-06-25 ENCOUNTER — Encounter (HOSPITAL_COMMUNITY): Payer: Self-pay

## 2015-06-25 ENCOUNTER — Telehealth: Payer: Self-pay | Admitting: Internal Medicine

## 2015-06-25 NOTE — Telephone Encounter (Signed)
Patient came into the office after his appointment with Dr. Gaynelle Arabian and said he has an appt this coming up Monday 06/28/15 to have the kidney stones blasted.

## 2015-06-25 NOTE — Progress Notes (Signed)
Spoke w Darrel Reach in office. Patient did receive a toradol injection today about 11a or earlier.  That will be greater than 72 window for litho

## 2015-06-25 NOTE — Telephone Encounter (Signed)
Patient just wanted Dr. Renold Genta to know that he is having problems with kidney stones and he is going to see Dr. Gaynelle Arabian this morning at 10:30am.

## 2015-06-28 ENCOUNTER — Ambulatory Visit (HOSPITAL_COMMUNITY)
Admission: RE | Admit: 2015-06-28 | Discharge: 2015-06-28 | Disposition: A | Discharge status: Home / Self Care | Payer: Self-pay | Source: Ambulatory Visit | Attending: Urology | Admitting: Urology

## 2015-06-28 ENCOUNTER — Encounter (HOSPITAL_COMMUNITY): Payer: Self-pay | Admitting: *Deleted

## 2015-06-28 ENCOUNTER — Ambulatory Visit (HOSPITAL_COMMUNITY): Payer: Self-pay

## 2015-06-28 ENCOUNTER — Encounter (HOSPITAL_COMMUNITY)
Admission: RE | Disposition: A | Discharge status: Home / Self Care | Payer: Self-pay | Source: Ambulatory Visit | Attending: Urology

## 2015-06-28 DIAGNOSIS — K219 Gastro-esophageal reflux disease without esophagitis: Secondary | ICD-10-CM | POA: Insufficient documentation

## 2015-06-28 DIAGNOSIS — N201 Calculus of ureter: Secondary | ICD-10-CM

## 2015-06-28 DIAGNOSIS — Z79899 Other long term (current) drug therapy: Secondary | ICD-10-CM | POA: Insufficient documentation

## 2015-06-28 DIAGNOSIS — E119 Type 2 diabetes mellitus without complications: Secondary | ICD-10-CM | POA: Insufficient documentation

## 2015-06-28 DIAGNOSIS — N202 Calculus of kidney with calculus of ureter: Secondary | ICD-10-CM | POA: Insufficient documentation

## 2015-06-28 DIAGNOSIS — Z7982 Long term (current) use of aspirin: Secondary | ICD-10-CM | POA: Insufficient documentation

## 2015-06-28 DIAGNOSIS — I10 Essential (primary) hypertension: Secondary | ICD-10-CM | POA: Insufficient documentation

## 2015-06-28 HISTORY — DX: Major depressive disorder, single episode, unspecified: F32.9

## 2015-06-28 HISTORY — DX: Headache, unspecified: R51.9

## 2015-06-28 HISTORY — DX: Calculus of kidney: N20.0

## 2015-06-28 HISTORY — DX: Type 2 diabetes mellitus without complications: E11.9

## 2015-06-28 HISTORY — DX: Depression, unspecified: F32.A

## 2015-06-28 HISTORY — DX: Anxiety disorder, unspecified: F41.9

## 2015-06-28 HISTORY — DX: Headache: R51

## 2015-06-28 HISTORY — DX: Essential (primary) hypertension: I10

## 2015-06-28 LAB — GLUCOSE, CAPILLARY
GLUCOSE-CAPILLARY: 105 mg/dL — AB (ref 65–99)
GLUCOSE-CAPILLARY: 92 mg/dL (ref 65–99)

## 2015-06-28 SURGERY — LITHOTRIPSY, ESWL
Anesthesia: LOCAL | Laterality: Left

## 2015-06-28 MED ORDER — DIAZEPAM 5 MG PO TABS
10.0000 mg | ORAL_TABLET | ORAL | Status: AC
  Administered 2015-06-28: 10 mg via ORAL
  Filled 2015-06-28: qty 2

## 2015-06-28 MED ORDER — SODIUM CHLORIDE 0.9 % IV SOLN
INTRAVENOUS | Status: DC
  Administered 2015-06-28: 16:00:00 via INTRAVENOUS

## 2015-06-28 MED ORDER — CIPROFLOXACIN HCL 500 MG PO TABS
500.0000 mg | ORAL_TABLET | ORAL | Status: AC
  Administered 2015-06-28: 500 mg via ORAL
  Filled 2015-06-28: qty 1

## 2015-06-28 MED ORDER — DIPHENHYDRAMINE HCL 25 MG PO CAPS
25.0000 mg | ORAL_CAPSULE | ORAL | Status: AC
Start: 2015-06-28 — End: 2015-06-28
  Administered 2015-06-28: 25 mg via ORAL
  Filled 2015-06-28: qty 1

## 2015-06-28 NOTE — H&P (Signed)
Reason For Visit Seen today as a work-in for left flan pain.   Active Problems Problems  1. Bilateral kidney stones (N20.0)   Assessed By: Reginald Adkins (Urology); Last Assessed: 25 Jun 2015 2. Calculus of left ureter (N20.1)   Assessed By: Reginald Adkins (Urology); Last Assessed: 25 Jun 2015  History of Present Illness [name] seen today as a work-in for left flank pain.     GU hx:  Last seen 2013 for issues with urge incontinence & his foreskin is very tight/painful. He has polydipsia & has been told in the past that he was borderline diabetic.    Hx of calcium oxalate kidney stones 2010.    Hx of herpes.     Jan 2017 Interval Hx:   Today states he has had several days of left flank pain radiating into LLQ. Denies hematuria or n/v. Has passed a few stones since last visit.   Past Medical History Problems  1. History of diabetes mellitus (Z86.39) 2. History of esophageal reflux (Z87.19) 3. History of herpes simplex infection (Z86.19) 4. History of hypertension (Z86.79)  Surgical History Problems  1. History of Cystoscopy With Insertion Of Ureteral Stent Left 2. History of Cystoscopy With Manipulation Of Ureteral Calculus Left 3. History of Cystoscopy With Ureteroscopy With Lithotripsy  Current Meds 1. AmLODIPine Besylate 5 MG Oral Tablet;  Therapy: (Recorded:13Jan2017) to Recorded 2. Aspirin 81 MG Oral Tablet Chewable;  Therapy: (Recorded:13Jan2017) to Recorded 3. Aspirin 81 MG TABS;  Therapy: (Recorded:26Mar2010) to Recorded 4. HydroCHLOROthiazide 25 MG Oral Tablet;  Therapy: (Recorded:13Jan2017) to Recorded 5. Ketorolac Tromethamine 60 MG/2ML Injection Solution; INJECT 60  MG Intramuscular;  To Be Done: [id number]; Status: HOLD FOR - Administration, Retrospective Authorization  Ordered 6. Losartan Potassium 100 MG Oral Tablet;  Therapy: (Recorded:13Jan2017) to Recorded 7. MetFORMIN HCl ER 500 MG Oral Tablet Extended  Release 24 Hour;  Therapy: (Recorded:13Jan2017) to Recorded 8. Sertraline HCl - 50 MG Oral Tablet;  Therapy: (Recorded:13Jan2017) to Recorded  Allergies Medication  1. Morphine Derivatives 2. Potassium Citrate ER TBCR  Family History Problems  1. Family history of Death In The Family Father : Mother, Father   father passed @ age 50unknown 2. Family history of kidney stone (Z84.1) : Brother 3. Family history of malignant neoplasm of kidney (Z80.51) : Brother  Social History Problems    Denied: History of Alcohol Use (History)   Caffeine Use   2 drinks daily   Marital History - Single   Occupation:   Chief Executive Officer   Denied: History of Tobacco Use  Review of Systems Genitourinary, constitutional, skin, eye, otolaryngeal, hematologic/lymphatic, cardiovascular, pulmonary, endocrine, musculoskeletal, gastrointestinal, neurological and psychiatric system(s) were reviewed and pertinent findings if present are noted and are otherwise negative.  Genitourinary: urinary frequency, feelings of urinary urgency, nocturia and incontinence.  Gastrointestinal: flank pain and abdominal pain.  Eyes: blurred vision.    Vitals Vital Signs [Data Includes: Last 1 Day]  Recorded: [id number] 11:21AM  Blood Pressure: 147 / 90 Temperature: 98.3 F Heart Rate: 111  Physical Exam Constitutional: Well nourished and well developed . No acute distress. The patient appears well hydrated.  ENT:. Examination of the teeth show poor dentition.  Neck: The appearance of the neck is normal.  Pulmonary: No respiratory distress.  Cardiovascular: Heart rate and rhythm are normal.  Abdomen: The abdomen is flat. Mild tenderness in the LLQ is present, but no tenderness in the RLQ. No right CVA tenderness and mild left CVA tenderness.  Skin: Normal skin turgor and normal skin color and pigmentation.  Neuro/Psych:. Mood and affect are appropriate.    Results/Data Urine [Data Includes: Last 1 Day]    [id number]  COLOR YELLOW   APPEARANCE CLEAR   SPECIFIC GRAVITY 1.020   pH 5.5   GLUCOSE NEGATIVE   BILIRUBIN NEGATIVE   KETONE NEGATIVE   BLOOD TRACE   PROTEIN NEGATIVE   NITRITE NEGATIVE   LEUKOCYTE ESTERASE NEGATIVE   SQUAMOUS EPITHELIAL/HPF NONE SEEN HPF  WBC 0-5 WBC/HPF  RBC 0-2 RBC/HPF  BACTERIA NONE SEEN HPF  CRYSTALS NONE SEEN HPF  CASTS NONE SEEN LPF  Yeast NONE SEEN HPF   The following images/tracing/specimen were independently visualized:  KU:B Shows bilateral renal stones. Left 8 X 9 mm proximal left ureteral stone.  The following clinical lab reports were reviewed:  UA: negative except dipstick blood.    Assessment Assessed  1. Calculus of left ureter (N20.1) 2. Bilateral kidney stones (N20.0)  Plan Bilateral kidney stones  1. Administered: Ketorolac Tromethamine 60 MG/2ML Injection Solution Calculus of left ureter  2. Start: Oxycodone-Acetaminophen 5-325 MG Oral Tablet; TAKE 1 TO 2 TABLETS EVERY 4  TO 6 HOURS AS NEEDED 3. Start: Tamsulosin HCl - 0.4 MG Oral Capsule; TAKE 1 CAPSULE Daily Health Maintenance  4. UA With REFLEX; [Do Not Release]; Status:Complete;   Done: [id number] 11:05AM  Ketorolac 60 mg IM today  Oxycodone 5/325 mg 1-2 po Q4-6 hrs prn  Will proceed with (L) ureteral ESWL 06/28/15. Risks and benefits discussed of possible need for second procedure either ESWL vs cystourethroscopy, (L) RPG, stone extraction, and double J stent. Also understands that there could also be hematuria post procedure. All questions addressed and answered and pt wishes to proceed.   Signatures Electronically signed by : Reginald Adkins, Trey Paula; Jun 25 2015 11:52AM EST

## 2015-06-28 NOTE — Progress Notes (Signed)
Dr. Jetta Lout, Radiologist, called to state the three stones in the xray are still in the same position. This is from his pre litho KUB.

## 2015-06-28 NOTE — Discharge Instructions (Addendum)
Kidney Stones Kidney stones (urolithiasis) are deposits that form inside your kidneys. The intense pain is caused by the stone moving through the urinary tract. When the stone moves, the ureter goes into spasm around the stone. The stone is usually passed in the urine.  CAUSES   A disorder that makes certain neck glands produce too much parathyroid hormone (primary hyperparathyroidism).  A buildup of uric acid crystals, similar to gout in your joints.  Narrowing (stricture) of the ureter.  A kidney obstruction present at birth (congenital obstruction).  Previous surgery on the kidney or ureters.  Numerous kidney infections. SYMPTOMS   Feeling sick to your stomach (nauseous).  Throwing up (vomiting).  Blood in the urine (hematuria).  Pain that usually spreads (radiates) to the groin.  Frequency or urgency of urination. DIAGNOSIS   Taking a history and physical exam.  Blood or urine tests.  CT scan.  Occasionally, an examination of the inside of the urinary bladder (cystoscopy) is performed. TREATMENT   Observation.  Increasing your fluid intake.  Extracorporeal shock wave lithotripsy--This is a noninvasive procedure that uses shock waves to break up kidney stones.  Surgery may be needed if you have severe pain or persistent obstruction. There are various surgical procedures. Most of the procedures are performed with the use of small instruments. Only small incisions are needed to accommodate these instruments, so recovery time is minimized. The size, location, and chemical composition are all important variables that will determine the proper choice of action for you. Talk to your health care provider to better understand your situation so that you will minimize the risk of injury to yourself and your kidney.  HOME CARE INSTRUCTIONS   Drink enough water and fluids to keep your urine clear or pale yellow. This will help you to pass the stone or stone fragments.  Strain  all urine through the provided strainer. Keep all particulate matter and stones for your health care provider to see. The stone causing the pain may be as small as a grain of salt. It is very important to use the strainer each and every time you pass your urine. The collection of your stone will allow your health care provider to analyze it and verify that a stone has actually passed. The stone analysis will often identify what you can do to reduce the incidence of recurrences.  Only take over-the-counter or prescription medicines for pain, discomfort, or fever as directed by your health care provider.  Keep all follow-up visits as told by your health care provider. This is important.  Get follow-up X-rays if required. The absence of pain does not always mean that the stone has passed. It may have only stopped moving. If the urine remains completely obstructed, it can cause loss of kidney function or even complete destruction of the kidney. It is your responsibility to make sure X-rays and follow-ups are completed. Ultrasounds of the kidney can show blockages and the status of the kidney. Ultrasounds are not associated with any radiation and can be performed easily in a matter of minutes.  Make changes to your daily diet as told by your health care provider. You may be told to:  Limit the amount of salt that you eat.  Eat 5 or more servings of fruits and vegetables each day.  Limit the amount of meat, poultry, fish, and eggs that you eat.  Collect a 24-hour urine sample as told by your health care provider.You may need to collect another urine sample every 6-12  months. SEEK MEDICAL CARE IF:  You experience pain that is progressive and unresponsive to any pain medicine you have been prescribed. SEEK IMMEDIATE MEDICAL CARE IF:   Pain cannot be controlled with the prescribed medicine.  You have a fever or shaking chills.  The severity or intensity of pain increases over 18 hours and is not  relieved by pain medicine.  You develop a new onset of abdominal pain.  You feel faint or pass out.  You are unable to urinate.   This information is not intended to replace advice given to you by your health care provider. Make sure you discuss any questions you have with your health care provider.   Document Released: 05/29/2005 Document Revised: 02/17/2015 Document Reviewed: 10/30/2012 Elsevier Interactive Patient Education 2016 Elsevier Inc[employer]    Moderate Conscious Sedation, Adult, Care After Refer to this sheet in the next few weeks. These instructions provide you with information on caring for yourself after your procedure. Your health care provider may also give you more specific instructions. Your treatment has been planned according to current medical practices, but problems sometimes occur. Call your health care provider if you have any problems or questions after your procedure. WHAT TO EXPECT AFTER THE PROCEDURE  After your procedure:  You may feel sleepy, clumsy, and have poor balance for several hours.  Vomiting may occur if you eat too soon after the procedure. HOME CARE INSTRUCTIONS  Do not participate in any activities where you could become injured for at least 24 hours. Do not:  Drive.  Swim.  Ride a bicycle.  Operate heavy machinery.  Cook.  Use power tools.  Climb ladders.  Work from a high place.  Do not make important decisions or sign legal documents until you are improved.  If you vomit, drink water, juice, or soup when you can drink without vomiting. Make sure you have little or no nausea before eating solid foods.  Only take over-the-counter or prescription medicines for pain, discomfort, or fever as directed by your health care provider.  Make sure you and your family fully understand everything about the medicines given to you, including what side effects may occur.  You should not drink alcohol, take sleeping pills, or take medicines  that cause drowsiness for at least 24 hours.  If you smoke, do not smoke without supervision.  If you are feeling better, you may resume normal activities 24 hours after you were sedated.  Keep all appointments with your health care provider. SEEK MEDICAL CARE IF:  Your skin is pale or bluish in color.  You continue to feel nauseous or vomit.  Your pain is getting worse and is not helped by medicine.  You have bleeding or swelling.  You are still sleepy or feeling clumsy after 24 hours. SEEK IMMEDIATE MEDICAL CARE IF:  You develop a rash.  You have difficulty breathing.  You develop any type of allergic problem.  You have a fever. MAKE SURE YOU:  Understand these instructions.  Will watch your condition.  Will get help right away if you are not doing well or get worse.   This information is not intended to replace advice given to you by your health care provider. Make sure you discuss any questions you have with your health care provider.   Document Released: 03/19/2013 Document Revised: 06/19/2014 Document Reviewed: 03/19/2013 Elsevier Interactive Patient Education [employer].

## 2015-06-28 NOTE — Interval H&P Note (Signed)
History and Physical Interval Note:  06/28/2015 5:50 PM  Reginald Adkins  has presented today for surgery, with the diagnosis of LEFT MID URETERAL STONE   The various methods of treatment have been discussed with the patient and family. After consideration of risks, benefits and other options for treatment, the patient has consented to  Procedure(s): LEFT EXTRACORPOREAL SHOCK WAVE LITHOTRIPSY (ESWL) (Left) as a surgical intervention .  The patient's history has been reviewed, patient examined, no change in status, stable for surgery.  I have reviewed the patient's chart and labs.  Questions were answered to the patient's satisfaction.     Levin Dagostino I Alizea Pell

## 2015-07-02 ENCOUNTER — Other Ambulatory Visit: Payer: Self-pay | Admitting: Internal Medicine

## 2015-07-27 ENCOUNTER — Other Ambulatory Visit: Payer: Self-pay | Admitting: Internal Medicine

## 2015-07-27 NOTE — Telephone Encounter (Signed)
Needs a refill on Losartan 100mg .  He would like a 90 day supply if possible.    [facility] [place]  Also wanted to let you know that he had Lithotrypsy and is doing well.  He had no pain after the procedure.

## 2015-10-26 ENCOUNTER — Telehealth: Payer: Self-pay | Admitting: Internal Medicine

## 2015-10-26 MED ORDER — LOSARTAN POTASSIUM 100 MG PO TABS: 100.0000 mg | ORAL_TABLET | Freq: Every day | ORAL | Status: DC

## 2015-10-26 NOTE — Telephone Encounter (Signed)
Medication sent to pharmacy  

## 2015-10-26 NOTE — Telephone Encounter (Signed)
Pt called requesting a refill on his Losartan medication. He uses the [facility] in [place] on [address]. Pt states that he only has two pills left. Please advise. Pt stated that he, "is still holding on everything," and that he will see you on July 11th for his f/u appt.

## 2015-10-26 NOTE — Telephone Encounter (Signed)
Please refill through July

## 2015-11-17 ENCOUNTER — Other Ambulatory Visit: Payer: Self-pay | Admitting: Internal Medicine

## 2015-11-30 ENCOUNTER — Ambulatory Visit: Payer: Self-pay | Admitting: Internal Medicine

## 2015-12-21 ENCOUNTER — Encounter: Payer: Self-pay | Admitting: Internal Medicine

## 2015-12-21 ENCOUNTER — Ambulatory Visit (INDEPENDENT_AMBULATORY_CARE_PROVIDER_SITE_OTHER): Payer: Self-pay | Admitting: Internal Medicine

## 2015-12-21 VITALS — BP 140/84 | HR 75 | Temp 98.3°F | Resp 18 | Wt 187.5 lb

## 2015-12-21 DIAGNOSIS — Z8659 Personal history of other mental and behavioral disorders: Secondary | ICD-10-CM

## 2015-12-21 DIAGNOSIS — Z87442 Personal history of urinary calculi: Secondary | ICD-10-CM

## 2015-12-21 DIAGNOSIS — I10 Essential (primary) hypertension: Secondary | ICD-10-CM

## 2015-12-21 DIAGNOSIS — E119 Type 2 diabetes mellitus without complications: Secondary | ICD-10-CM

## 2015-12-21 LAB — HEMOGLOBIN A1C
Hgb A1c MFr Bld: 6 % — ABNORMAL HIGH (ref <5.7)
Mean Plasma Glucose: 126 mg/dL

## 2015-12-21 NOTE — Progress Notes (Signed)
   Subjective:    Patient ID: Reginald Adkins, male    DOB: May 14, 1951, 65 y.o.   MRN: [id number]  HPI 65 year old White male in today for six-month follow-up on uncontrolled type 2 diabetes mellitus, depression, essential hypertension. His brother passed away in 2022/08/31 of complications of kidney cancer. He has another brother he's looking after his has Parkinson's disease and yet another brother who has history of stroke. He continues to work with yard maintenance. He's gained some weight. ND Semb or he weighed 168 pounds and now weighs 187.5 pounds. Says he's not been following a strict diet is much as he should. Says he feels okay and has no new complaints.  He's also had lithotripsy by Dr. Era Bumpers in the past few months.  Patient will be 65 in October and he will then have Medicare benefits. We'll plan to do physical exam in December.    Review of Systems see above     Objective:   Physical Exam  Skin warm and dry. Nodes none. Neck is supple without JVD thyromegaly or carotid bruits. Chest clear to auscultation. Cardiac exam regular rate and rhythm normal S1 and S2. Extremities without edema.      Assessment & Plan:  Essential hypertension-stable. Needs to diet exercise and lose a bit await  Controlled type 2 diabetes mellitus-hemoglobin A1c drawn and pending. Do urine microalbumin at next visit which will be Welcome to Medicare physical exam  History of depression-stable  History of kidney stones  Plan: Continue same medications and return in the summer for physical examination which will be welcome to Medicare physical exam

## 2015-12-21 NOTE — Patient Instructions (Signed)
Continue diet exercise and weight loss efforts. Continue same medications. Return in the December for Welcome to Medicare physical exam. Hemoglobin A1c drawn and pending.

## 2016-01-21 ENCOUNTER — Other Ambulatory Visit: Payer: Self-pay | Admitting: Internal Medicine

## 2016-04-13 ENCOUNTER — Other Ambulatory Visit: Payer: Self-pay | Admitting: Internal Medicine

## 2016-06-02 ENCOUNTER — Telehealth: Payer: Self-pay | Admitting: Internal Medicine

## 2016-06-02 ENCOUNTER — Encounter: Payer: Self-pay | Admitting: Internal Medicine

## 2016-06-02 NOTE — Telephone Encounter (Signed)
Patient called at 4:30 PM on December 22 to say he would be unable to keep welcome to Medicare physical exam appointment on December 29. He has to be with someone who had a fall and will be having surgery at [facility] that day. His appointment has been rescheduled to January 26 at 10 AM for Welcome to Medicare physical exam and lab work

## 2016-06-09 ENCOUNTER — Encounter: Payer: Self-pay | Admitting: Internal Medicine

## 2016-06-20 IMAGING — CR DG ABDOMEN 1V
2 series · 2 of 2 positions shown · non-contrast
Comparison: 06/25/2015

CLINICAL DATA: History of kidney stones. Patient is pre
lithotripsy.

EXAM:
ABDOMEN - 1 VIEW

[t abdomen supine (1 of 2)]
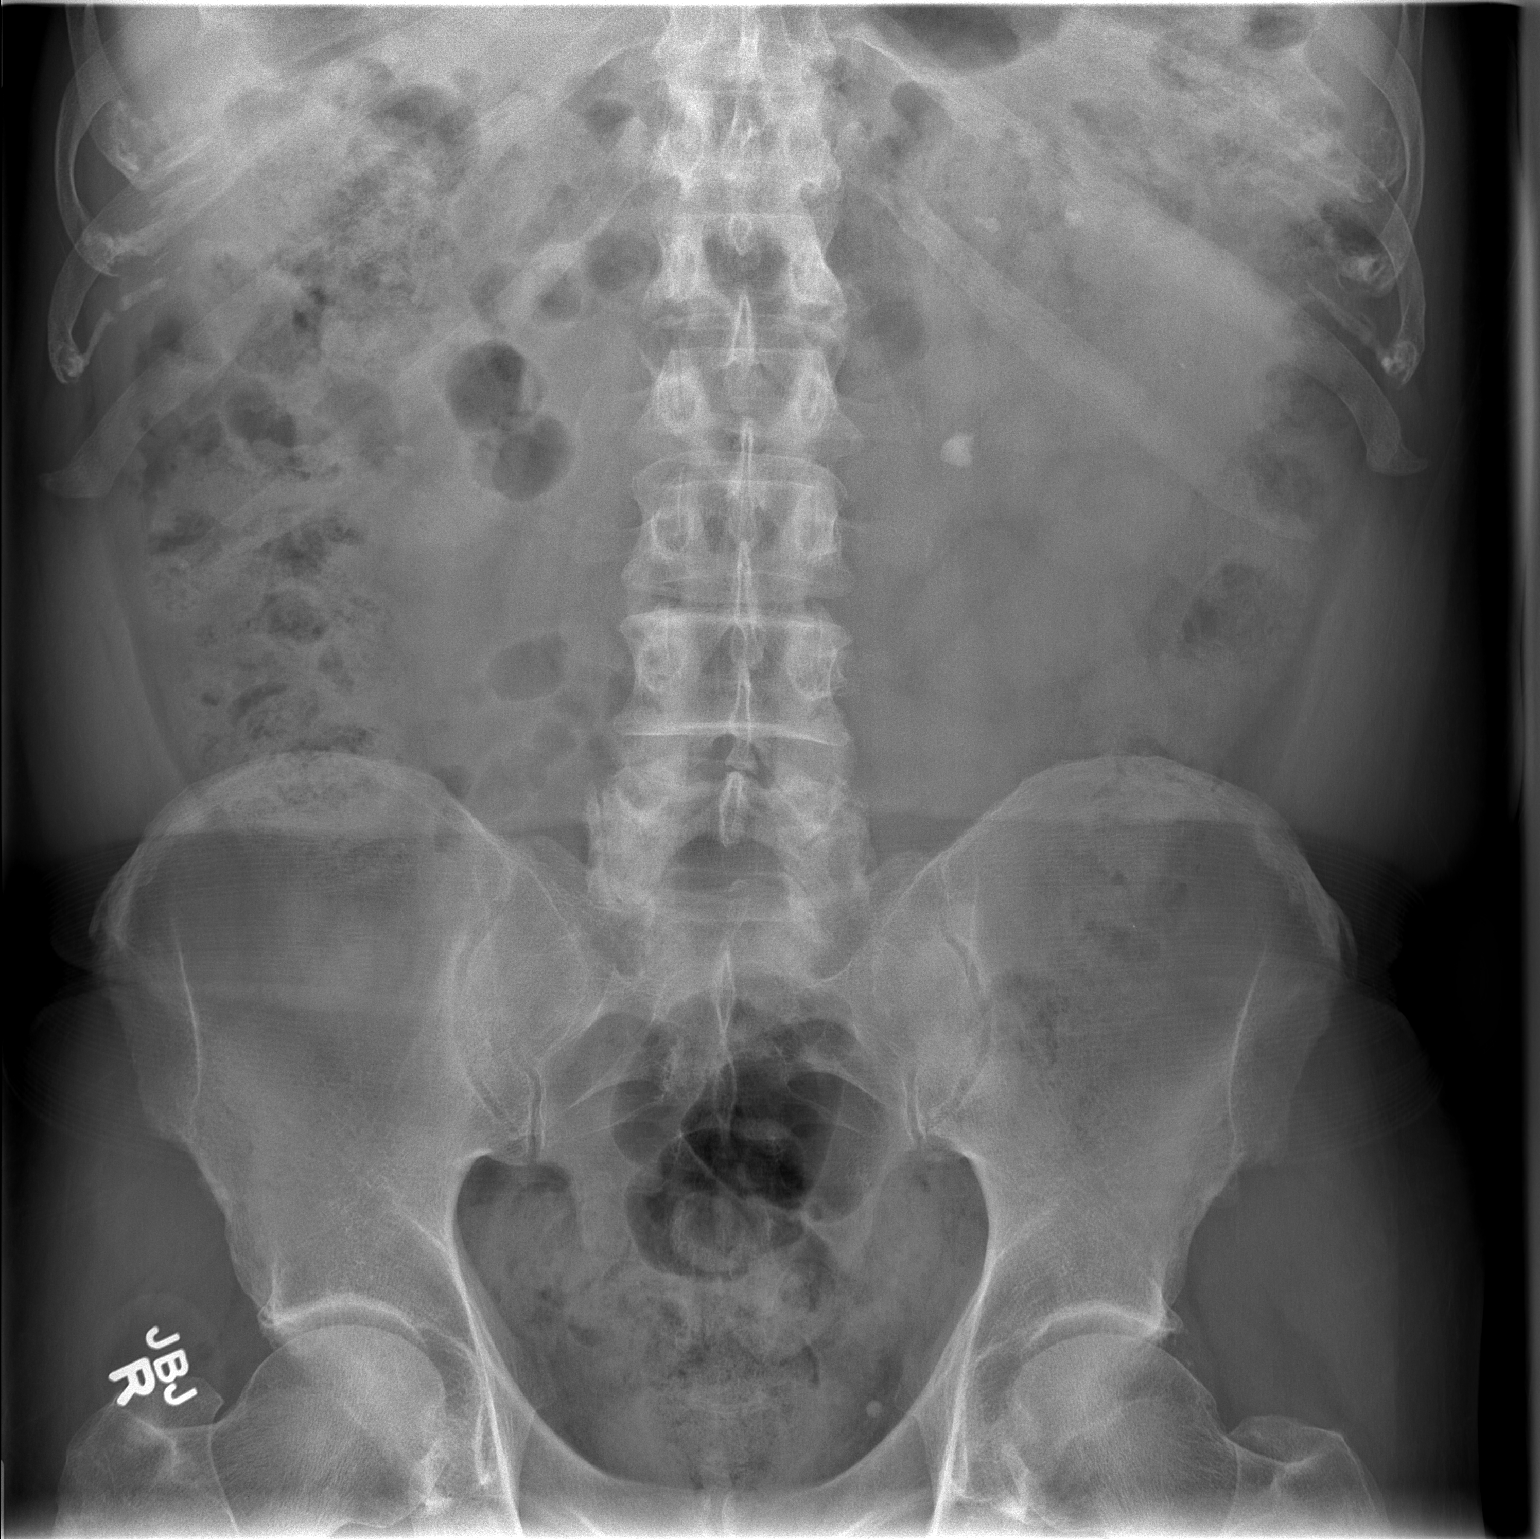

[t abdomen supine (2 of 2)]
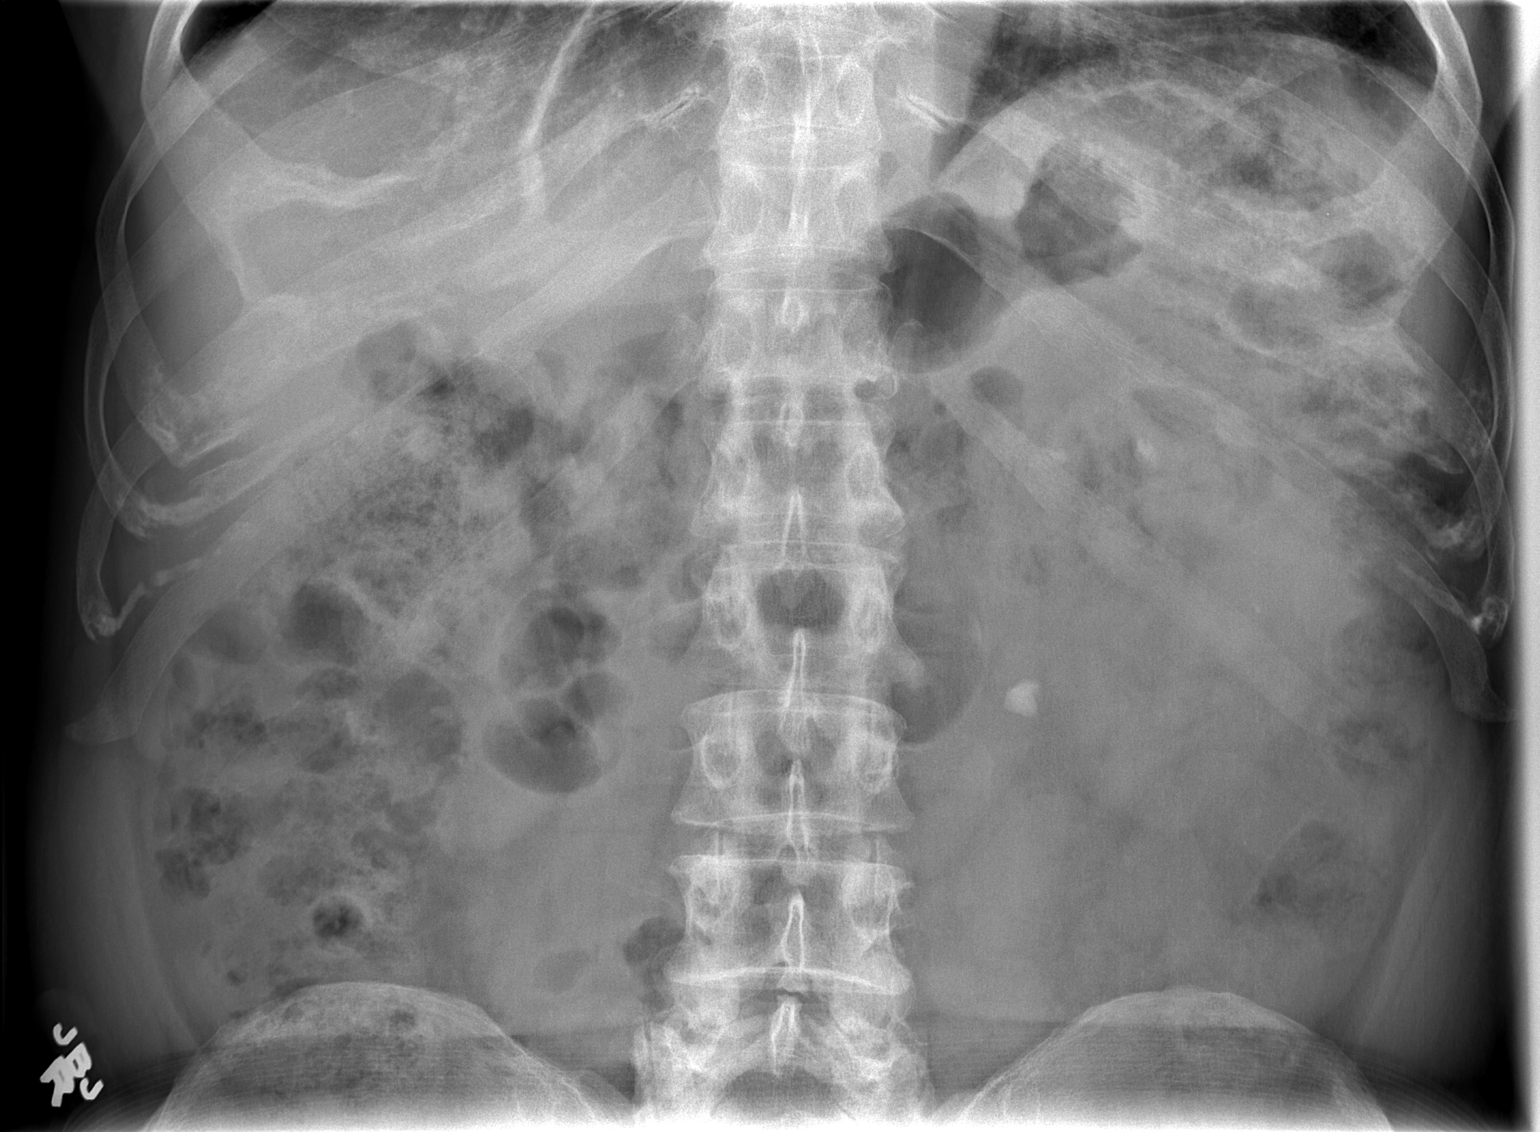

[2 of 2 positions shown; findings below may reference images not displayed]

FINDINGS: The bowel gas pattern is normal. There is a stable in appearance 10
mm irregularly-shaped calcific body overlying the left renal
shadow/proximal left ureter. Two other smaller calcific structures
are seen slightly more superiorly. Given these have not changed in
positioning, they likely represent renal or ureteral calculi, rather
than contents of intestinal matter.
IMPRESSION: Three calcific structures, the largest of which measures 10 mm
overlie the left renal shadow/ left proximal ureter. These likely
represent left renal/proximal ureteral calculi, not changed from
06/25/2015.

These results were called by telephone at the time of interpretation
on 06/28/2015 at [DATE] to Nhanhayaa Dinnani,RN, who verbally
acknowledged these results.

## 2016-06-22 ENCOUNTER — Telehealth: Payer: Self-pay | Admitting: Internal Medicine

## 2016-06-22 NOTE — Telephone Encounter (Signed)
Before I could route the note to Dr. Renold Genta, patient called back and stated that his stomach was feeling better and actually "growling" as though he was hungry.  Advised patient to please eat light foods such as broths and soups until he is feeling better.  He is instructed to call us back if he isn't feeling better.  Patient verbalized understanding of these instructions.

## 2016-06-22 NOTE — Telephone Encounter (Signed)
Patient states that he doesn't think that he has the flu; however, he has been sick to his stomach overnight.  He was vomiting last night and then had dry heaves.  States that his stomach is sore from the dry heaving.  And, he was unable to sleep last night due to the dry heaves.  He seems to be nauseous today from whatever this is.  Doesn't have diarrhea and says he does feel that he has a fever.  He said that he is trying to eat some crackers just to get some source of food into his body.  However, he just feels terrible.  Said that he hates to drive all the way here for this.  He wants to know if you would call him something in for this.    Pharmacy:  [facility] @ [place]

## 2016-07-02 ENCOUNTER — Other Ambulatory Visit: Payer: Self-pay | Admitting: Internal Medicine

## 2016-07-07 ENCOUNTER — Encounter: Payer: Self-pay | Admitting: Internal Medicine

## 2016-07-07 ENCOUNTER — Ambulatory Visit (INDEPENDENT_AMBULATORY_CARE_PROVIDER_SITE_OTHER): Payer: Commercial Managed Care - HMO | Admitting: Internal Medicine

## 2016-07-07 VITALS — BP 160/100 | HR 76 | Temp 98.6°F | Ht 69.0 in | Wt 193.0 lb

## 2016-07-07 DIAGNOSIS — E119 Type 2 diabetes mellitus without complications: Secondary | ICD-10-CM

## 2016-07-07 DIAGNOSIS — Z125 Encounter for screening for malignant neoplasm of prostate: Secondary | ICD-10-CM

## 2016-07-07 DIAGNOSIS — F439 Reaction to severe stress, unspecified: Secondary | ICD-10-CM | POA: Diagnosis not present

## 2016-07-07 DIAGNOSIS — F3289 Other specified depressive episodes: Secondary | ICD-10-CM | POA: Diagnosis not present

## 2016-07-07 DIAGNOSIS — Z0000 Encounter for general adult medical examination without abnormal findings: Secondary | ICD-10-CM | POA: Diagnosis not present

## 2016-07-07 DIAGNOSIS — I10 Essential (primary) hypertension: Secondary | ICD-10-CM

## 2016-07-07 LAB — CBC WITH DIFFERENTIAL/PLATELET
BASOS ABS: 0 {cells}/uL (ref 0–200)
BASOS PCT: 0 %
EOS ABS: 73 {cells}/uL (ref 15–500)
Eosinophils Relative: 1 %
HEMATOCRIT: 44.7 % (ref 38.5–50.0)
Hemoglobin: 15.3 g/dL (ref 13.2–17.1)
Lymphocytes Relative: 23 %
Lymphs Abs: 1679 cells/uL (ref 850–3900)
MCH: 31.5 pg (ref 27.0–33.0)
MCHC: 34.2 g/dL (ref 32.0–36.0)
MCV: 92 fL (ref 80.0–100.0)
MONO ABS: 584 {cells}/uL (ref 200–950)
MPV: 9.4 fL (ref 7.5–12.5)
Monocytes Relative: 8 %
NEUTROS PCT: 68 %
Neutro Abs: 4964 cells/uL (ref 1500–7800)
Platelets: 261 10*3/uL (ref 140–400)
RBC: 4.86 MIL/uL (ref 4.20–5.80)
RDW: 13.3 % (ref 11.0–15.0)
WBC: 7.3 10*3/uL (ref 3.8–10.8)

## 2016-07-07 LAB — PSA: PSA: 0.2 ng/mL (ref <=4.0)

## 2016-07-07 NOTE — Progress Notes (Signed)
Subjective:    Patient ID: Reginald Adkins, male    DOB: 11-Nov-1950, 66 y.o.   MRN: [id number]  HPI 66 year old white male in today for Welcome to Medicare physical exam and evaluation of medical issues including diabetes mellitus, depression and hypertension.  He  Was started on metformin at [facility] by Dr. Suzi Roots around 2010. It worked well for some time. He was also on benazepril and atenolol for hypertension. He has a remote history of kidney stones. He had lithotripsy and a left ureteral stent placed in the past. Remote history of GE reflux. Denies smoking or significant  Alcohol consumption. He has a history of phimosis.  Dr. Gaynelle Arabian asked me to see him in 2013. He was having significant hyperglycemia issues with nocturia weakness and polyuria.  Social history: He is a native of [place]. He is single.Works as a Surveyor, quantity.  Family history: One brother died at age 64 with diabetes. Death was either due to a stroke or an [place] brother died recently with kidney cancer.  This is been depressing for him but he's trying to deal with it.    Review of Systemsno polydipsia or polyuria. Has some malaise and fatigue and think related to depression     Objective:   Physical Exam  Constitutional: He is oriented to person, place, and time. He appears well-developed and well-nourished. No distress.  HENT:  Head: Normocephalic and atraumatic.  Right Ear: External ear normal.  Left Ear: External ear normal.  Mouth/Throat: Oropharynx is clear and moist.  Eyes: Conjunctivae and EOM are normal. Pupils are equal, round, and reactive to light. Right eye exhibits no discharge. Left eye exhibits no discharge. No scleral icterus.  Neck: No JVD present. No thyromegaly present.  Cardiovascular: Normal rate, regular rhythm and normal heart sounds.   No murmur heard. Pulmonary/Chest: Effort normal and breath sounds normal. No respiratory distress. He has no wheezes.  He has no rales.  Abdominal: He exhibits no distension and no mass. There is no tenderness. There is no rebound and no guarding.  Genitourinary: Prostate normal.  Lymphadenopathy:    He has no cervical adenopathy.  Neurological: He is alert and oriented to person, place, and time. He has normal reflexes. No cranial nerve deficit. Coordination normal.  Skin: Skin is warm and dry. No rash noted. He is not diaphoretic.  Psychiatric: His behavior is normal. Judgment and thought content normal.  Affect is flat  Vitals reviewed.         Assessment & Plan:  Controlled type 2 diabetes  Essential hypertension  History of kidney stones  Depression  Plan: Continue same medications and Return In February for repeat blood pressure check and nurse visit. Prevnar 13 given today. Blood pressure is elevated today due to not eating and being anxious.  Subjective:   Patient presents for Medicare Annual/Subsequent preventive examination.  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 No  Over the past two weeks, have you felt little interest or pleasure in doing things? No Have you lost interest or pleasure in daily life? No Do you often feel hopeless? No  Do you cry easily over simple problems? No   Activities of Daily Living  In your present state of health, do you have any difficulty performing the following activities?:   Driving? No  Managing money? No  Feeding yourself? No  Getting from bed to chair? No  Climbing a flight of stairs? No  Preparing food and eating?: No  Bathing or  showering? No  Getting dressed: No  Getting to the toilet? No  Using the toilet:No  Moving around from place to place: No  In the past year have you fallen or had a near fall?:No  Are you sexually active? No  Do you have more than one partner? No   Hearing Difficulties: No  Do you often ask people to speak up or repeat themselves? No  Do you experience ringing or noises in your ears? No  Do you have difficulty understanding soft or whispered voices? No  Do you feel that you have a problem with memory? No Do you often misplace items? No    Home Safety:  Do you have a smoke alarm at your residence? Yes Do you have grab bars in the bathroom?No Do you have throw rugs in your house?Yes   Cognitive Testing  Alert? Yes Normal Appearance?Yes  Oriented to person? Yes Place? Yes  Time? Yes  Recall of three objects? Yes  Can perform simple calculations? Yes  Displays appropriate judgment?Yes  Can read the correct time from a watch face?Yes   List the Names of Other Physician/Practitioners you currently use:  See referral list for the physicians patient is currently seeing.  Dr. Gaynelle Arabian   Review of Systems: See above   Objective:     General appearance: Appears stated age  Head: Normocephalic, without obvious abnormality, atraumatic  Eyes: conj clear, EOMi PEERLA  Ears: normal TM's and external ear canals both ears  Nose: Nares normal. Septum midline. Mucosa normal. No drainage or sinus tenderness.  Throat: lips, mucosa, and tongue normal; teeth and gums normal  Neck: no adenopathy, no carotid bruit, no JVD, supple, symmetrical, trachea midline and thyroid not enlarged, symmetric, no tenderness/mass/nodules  No CVA tenderness.  Lungs: clear to auscultation bilaterally  Breasts: normal appearance Heart: regular rate and rhythm, S1, S2 normal, no murmur, click, rub or gallop  Abdomen: soft, non-tender; bowel sounds normal; no masses, no organomegaly  Musculoskeletal: ROM  normal in all joints, no crepitus, no deformity, Normal muscle strengthen. Back  is symmetric, no curvature. Skin: Skin color, texture, turgor normal. No rashes or lesions  Lymph nodes: Cervical, supraclavicular, and axillary nodes normal.  Neurologic: CN 2 -12 Normal, Normal symmetric reflexes. Normal coordination and gait  Psych: Alert & Oriented x 3, Mood appear stable.    Assessment:    Annual wellness medicare exam   Plan:    During the course of the visit the patient was educated and counseled about appropriate screening and preventive services including:   Annual flu vaccine     Patient Instructions (the written plan) was given to the patient.  Medicare Attestation  I have personally reviewed:  The patient's medical and social history  Their use of alcohol, tobacco or illicit drugs  Their current medications and supplements  The patient's functional ability including ADLs,fall risks, home safety risks, cognitive, and hearing and visual impairment  Diet and physical activities  Evidence for depression or mood disorders  The patient's weight, height, BMI, and visual acuity have been recorded in the chart. I have made referrals, counseling,  and provided education to the patient based on review of the above and I have provided the patient with a written personalized care plan for preventive services.

## 2016-07-07 NOTE — Patient Instructions (Signed)
Blood pressure is elevated today likely due to not eating and being anxious. Return in mid February for nurse visit and blood pressure check. Continue same medications. Prevnar 13 given today

## 2016-07-08 LAB — LIPID PANEL
CHOL/HDL RATIO: 4 ratio (ref <5.0)
Cholesterol: 140 mg/dL (ref <200)
HDL: 35 mg/dL — AB (ref >40)
LDL Cholesterol: 93 mg/dL (ref <100)
Triglycerides: 62 mg/dL (ref <150)
VLDL: 12 mg/dL (ref <30)

## 2016-07-08 LAB — COMPREHENSIVE METABOLIC PANEL
ALT: 16 U/L (ref 9–46)
AST: 17 U/L (ref 10–35)
Albumin: 4.5 g/dL (ref 3.6–5.1)
Alkaline Phosphatase: 60 U/L (ref 40–115)
BUN: 22 mg/dL (ref 7–25)
CALCIUM: 9.4 mg/dL (ref 8.6–10.3)
CHLORIDE: 102 mmol/L (ref 98–110)
CO2: 26 mmol/L (ref 20–31)
Creat: 1.06 mg/dL (ref 0.70–1.25)
GLUCOSE: 127 mg/dL — AB (ref 65–99)
POTASSIUM: 3.9 mmol/L (ref 3.5–5.3)
Sodium: 141 mmol/L (ref 135–146)
Total Bilirubin: 0.9 mg/dL (ref 0.2–1.2)
Total Protein: 7.2 g/dL (ref 6.1–8.1)

## 2016-07-08 LAB — MICROALBUMIN / CREATININE URINE RATIO
CREATININE, URINE: 163 mg/dL (ref 20–370)
Microalb Creat Ratio: 10 mcg/mg creat (ref <30)
Microalb, Ur: 1.6 mg/dL

## 2016-07-08 LAB — HEMOGLOBIN A1C
Hgb A1c MFr Bld: 6 % — ABNORMAL HIGH (ref <5.7)
MEAN PLASMA GLUCOSE: 126 mg/dL

## 2016-07-10 ENCOUNTER — Telehealth: Payer: Self-pay

## 2016-07-10 NOTE — Telephone Encounter (Signed)
Pt is calling to get lab results from Friday, thank you.

## 2016-07-10 NOTE — Telephone Encounter (Signed)
They have been mailed to him and are good.

## 2016-07-19 ENCOUNTER — Other Ambulatory Visit: Payer: Self-pay | Admitting: Internal Medicine

## 2016-07-28 ENCOUNTER — Ambulatory Visit (INDEPENDENT_AMBULATORY_CARE_PROVIDER_SITE_OTHER): Payer: Medicare HMO | Admitting: Internal Medicine

## 2016-07-28 ENCOUNTER — Encounter: Payer: Self-pay | Admitting: Internal Medicine

## 2016-07-28 VITALS — BP 150/80 | HR 94 | Temp 99.1°F

## 2016-07-28 DIAGNOSIS — E119 Type 2 diabetes mellitus without complications: Secondary | ICD-10-CM

## 2016-07-28 DIAGNOSIS — I10 Essential (primary) hypertension: Secondary | ICD-10-CM | POA: Diagnosis not present

## 2016-07-28 NOTE — Progress Notes (Signed)
   Subjective:    Patient ID: Reginald Adkins, male    DOB: 1951-03-19, 66 y.o.   MRN: [id number]  HPI  66 year old Male with DM and HTN for repeat BP check.  BP is elevated at 170/100 on arrival and a few minutes later was 150/80. He's concerned about this. Realizes he may be a little bit overweight. He weighed 193 pounds in January and 187 pounds in July 2017. In December 2016, he weighed 168 pounds. He got depressed of with his brother being ill with cancer. Realizes he's not watching his diet and exercising as much as he was previously. Says he gets anxious when he has his blood pressure checked here in this office.    Review of Systems     Objective:   Physical Exam  Not examined today. Spent 15 minutes speaking with him about these issues.      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 Encouraged diet exercise and weight loss

## 2016-10-19 ENCOUNTER — Other Ambulatory Visit: Payer: Self-pay | Admitting: Internal Medicine

## 2016-12-18 ENCOUNTER — Ambulatory Visit
Admission: EM | Admit: 2016-12-18 | Discharge: 2016-12-18 | Disposition: A | Discharge status: Home / Self Care | Payer: Medicare HMO | Attending: Family Medicine | Admitting: Family Medicine

## 2016-12-18 ENCOUNTER — Ambulatory Visit (INDEPENDENT_AMBULATORY_CARE_PROVIDER_SITE_OTHER): Payer: Medicare HMO

## 2016-12-18 DIAGNOSIS — W540XXA Bitten by dog, initial encounter: Secondary | ICD-10-CM | POA: Diagnosis not present

## 2016-12-18 DIAGNOSIS — T07XXXA Unspecified multiple injuries, initial encounter: Secondary | ICD-10-CM

## 2016-12-18 DIAGNOSIS — S61252A Open bite of right middle finger without damage to nail, initial encounter: Secondary | ICD-10-CM | POA: Diagnosis not present

## 2016-12-18 DIAGNOSIS — S61251A Open bite of left index finger without damage to nail, initial encounter: Secondary | ICD-10-CM

## 2016-12-18 DIAGNOSIS — S61212A Laceration without foreign body of right middle finger without damage to nail, initial encounter: Secondary | ICD-10-CM

## 2016-12-18 DIAGNOSIS — S61250A Open bite of right index finger without damage to nail, initial encounter: Secondary | ICD-10-CM | POA: Diagnosis not present

## 2016-12-18 MED ORDER — MUPIROCIN 2 % EX OINT: TOPICAL_OINTMENT | CUTANEOUS | 0 refills | Status: DC

## 2016-12-18 MED ORDER — AMOXICILLIN-POT CLAVULANATE 875-125 MG PO TABS: 1.0000 | ORAL_TABLET | Freq: Two times a day (BID) | ORAL | 0 refills | Status: DC

## 2016-12-18 NOTE — ED Triage Notes (Signed)
Patient stated friends dog bit him on the right hand. Was giving him a treat and dog caught first 2 fingers. Stated dog has had rabies shots and also he has had Tetanus.

## 2016-12-18 NOTE — ED Provider Notes (Signed)
[facility] ____________________________________________  Time seen: Approximately 3:09 PM  I have reviewed the triage vital signs and the nursing notes.   HISTORY  Chief Complaint Animal Bite and Extremity Laceration  HPI Reginald Adkins is a 66 y.o. male present for evaluation of dog bite to right second and third fingers. Patient reports that just prior to arrival he was putting and getting food to his friend's dog through the fence. Reports the dog then bit patient's right hand once then released. Denies any other bites or injuries. Reports the dog is a known pet who is up-to-date on rabies immunizations. States owner of the dog is at bedside. States mild pain to laceration sites only. Denies any other pain or injury. Denies paresthesias, decreased range of motion, pain radiation or other injury. Denies head injury or loss of consciousness. Reports right hand dominant. Reports patient is up-to-date on tetanus immunization and dog is up-to-date on rabies immunizations. Reports the dog is an a contained fence with the top and not exposed to other animals and sometimes indoors. Reports dog is a medium-sized Psychologist, sport and exercise between your boxer and a bulldog. Reports healthy dog. Reports that dog has never previously bitten someone.  Patient reports that he is a type II diabetic. Reports blood sugars have been running within normal range. Denies recent sickness. Denies recent antibiotic use.   Elby Showers, MD: PCP   Past Medical History:  Diagnosis Date  . Anxiety   . Depression   . Diabetes mellitus without complication ([place])    Type II w oral agents  . Headache    hx of migraines but have kind of subsided some  . Hypertension   . Stone, kidney    Renal stones;  left  Mid ureteral stone    Patient Active Problem List   Diagnosis Date Noted  . Office hypertension 03/16/2014  . Anxiety and depression 03/16/2014  . Controlled diabetes mellitus type II without complication  ([place]) 86/57/8469  . Hypertension 03/18/2012  . History of kidney stones 03/18/2012    Past Surgical History:  Procedure Laterality Date  . EXTRACORPOREAL SHOCK WAVE LITHOTRIPSY  early 2000s  . INNER EAR SURGERY Bilateral 1964   Multiple ear surgeries  . STONE EXTRACTION WITH BASKET   early 2000's  . TONSILLECTOMY      as child     No current facility-administered medications for this encounter.   Current Outpatient Prescriptions:  .  amLODipine (NORVASC) 5 MG tablet, TAKE ONE TABLET BY MOUTH ONCE DAILY, Disp: 90 tablet, Rfl: 3 .  amoxicillin-clavulanate (AUGMENTIN) 875-125 MG tablet, Take 1 tablet by mouth every 12 (twelve) hours., Disp: 20 tablet, Rfl: 0 .  fluticasone (FLONASE) 50 MCG/ACT nasal spray, Place 2 sprays into both nostrils daily., Disp: 16 g, Rfl: 1 .  hydrochlorothiazide (HYDRODIURIL) 25 MG tablet, TAKE ONE TABLET BY MOUTH ONCE DAILY, Disp: 90 tablet, Rfl: 3 .  loratadine (CLARITIN) 10 MG tablet, Take 1 tablet (10 mg total) by mouth daily. (Patient taking differently: Take 10 mg by mouth as needed. ), Disp: 30 tablet, Rfl: 2 .  losartan (COZAAR) 100 MG tablet, TAKE ONE TABLET BY MOUTH ONCE DAILY, Disp: 90 tablet, Rfl: 3 .  metFORMIN (GLUCOPHAGE) 500 MG tablet, TAKE ONE TABLET BY MOUTH TWICE DAILY WITH MEALS, Disp: 180 tablet, Rfl: 3 .  mupirocin ointment (BACTROBAN) 2 %, Apply three times a day for 7 days., Disp: 22 g, Rfl: 0 .  sertraline (ZOLOFT) 50 MG tablet, TAKE ONE TABLET BY  MOUTH ONCE DAILY, Disp: 30 tablet, Rfl: 11  Allergies Morphine and related  Family History  Problem Relation Age of Onset  . Cancer Brother     Social History Social History  Substance Use Topics  . Smoking status: Never Smoker  . Smokeless tobacco: Never Used  . Alcohol use No    Review of Systems Constitutional: No fever/chills Cardiovascular: Denies chest pain. Respiratory: Denies shortness of breath. Gastrointestinal: No abdominal pain. Musculoskeletal: Negative for back  pain. Skin: As above.    ____________________________________________   PHYSICAL EXAM:  VITAL SIGNS: ED Triage Vitals  Enc Vitals Group     BP 12/18/16 1429 (!) 154/82     Pulse Rate 12/18/16 1429 (!) 102     Resp --      Temp 12/18/16 1429 98.5 F (36.9 C)     Temp Source 12/18/16 1429 Oral     SpO2 12/18/16 1429 97 %     Weight 12/18/16 1432 185 lb (83.9 kg)     Height 12/18/16 1432 5\' 10"  (1.778 m)     Head Circumference --      Peak Flow --      Pain Score --      Pain Loc --      Pain Edu? --      Excl. in [place]? --     Constitutional: Alert and oriented. Well appearing and in no acute distress. Cardiovascular: Normal rate, regular rhythm. Grossly normal heart sounds.  Good peripheral circulation. Respiratory: Normal respiratory effort without tachypnea nor retractions. Breath sounds are clear and equal bilaterally. No wheezes, rales, rhonchi. Gastrointestinal: Soft and nontender. No distention. Normal Bowel sounds. No CVA tenderness. Musculoskeletal:  Nontender with normal range of motion in all extremities. No midline cervical, thoracic or lumbar tenderness to palpation. Bilateral pedal pulses equal and easily palpated.      Right lower leg:  No tenderness or edema.      Left lower leg:  No tenderness or edema.  Neurologic:  Normal speech and language. No gross focal neurologic deficits are appreciated. Speech is normal. No gait instability.  Skin:  Skin is warm, dry. Except: Right second and third digits multiple puncture wounds to the dorsal aspect of both fingers, 2 puncture wounds to Palmer aspect of right second and third finger, base of third proximal phalanx palmar aspect 1 cm laceration with mild active bleeding, no foreign bodies visualized, second and third digit with full range of motion present, no motor or tendon deficit, right hand otherwise nontender. Psychiatric: Mood and affect are normal. Speech and behavior are normal. Patient exhibits appropriate insight  and judgment   ___________________________________________   LABS (all labs ordered are listed, but only abnormal results are displayed)  Labs Reviewed - No data to display  RADIOLOGY  Dg Finger Index Right  Result Date: 12/18/2016 CLINICAL DATA:  Pain, laceration, dog bite EXAM: RIGHT INDEX FINGER 2+V COMPARISON:  None FINDINGS: No acute bony abnormality. Specifically, no fracture, subluxation, or dislocation. Soft tissues are intact. No radiopaque foreign body. IMPRESSION: No acute bony abnormality or radiopaque foreign body. Electronically Signed   By: Rolm Baptise M.D.   On: 12/18/2016 15:46   Dg Finger Middle Right  Result Date: 12/18/2016 CLINICAL DATA:  Dog bite EXAM: RIGHT MIDDLE FINGER 2+V COMPARISON:  None. FINDINGS: No acute bony abnormality. Specifically, no fracture, subluxation, or dislocation. Soft tissues are intact. No radiopaque foreign body. IMPRESSION: No acute bony abnormality or radiopaque foreign body. Electronically Signed   By:  Rolm Baptise M.D.   On: 12/18/2016 15:46   ____________________________________________   PROCEDURES Procedures     INITIAL IMPRESSION / ASSESSMENT AND PLAN / ED COURSE  Pertinent labs & imaging results that were available during my care of the patient were reviewed by me and considered in my medical decision making (see chart for details).  Animal control contacted from [employer]  Appearing patient. No acute distress. Presenting for evaluation of dog bites to right second and third fingers that occurred just prior to arrival. Animal is reported to be up-to-date on rabies immunizations. Patient reports his tetanus immunization is up-to-date. Patient states he knows it is within 10 years, and will call and verify with his primary care tomorrow and verify within 5 yrs. will evaluate by x-ray right second and third digits, xrays negative for acute changes. Will place patient on oral Augmentin, topical Bactroban. Discussed wound care and  close monitoring.Discussed indication, risks and benefits of medications with patient.  Discussed follow up with Primary care physician this week. Discussed follow up and return parameters including no resolution or any worsening concerns. Patient verbalized understanding and agreed to plan.   ____________________________________________   FINAL CLINICAL IMPRESSION(S) / ED DIAGNOSES  Final diagnoses:  Dog bite, initial encounter  Multiple puncture wounds  Laceration of right middle finger without foreign body without damage to nail, initial encounter     Discharge Medication List as of 12/18/2016  3:52 PM    START taking these medications   Details  amoxicillin-clavulanate (AUGMENTIN) 875-125 MG tablet Take 1 tablet by mouth every 12 (twelve) hours., Starting Mon 12/18/2016, Normal    mupirocin ointment (BACTROBAN) 2 % Apply three times a day for 7 days., Normal        Note: This dictation was prepared with Dragon dictation along with smaller phrase technology. Any transcriptional errors that result from this process are unintentional.         Marylene Land, NP 12/18/16 1738

## 2016-12-18 NOTE — Discharge Instructions (Signed)
Take medication as prescribed. Rest. Drink plenty of fluids. Keep clean.  ° °Follow up with your primary care physician this week as needed. Return to Urgent care for new or worsening concerns.  ° °

## 2016-12-19 ENCOUNTER — Telehealth: Payer: Self-pay | Admitting: Internal Medicine

## 2016-12-19 NOTE — Telephone Encounter (Signed)
Patient called this morning to verify if his Tdap is current or not.  States that he was feeding his friends dog for him yesterday and the stuck his hand through the fence and the dog bit his hand when the dog got excited due to another dog barking next door.  The dog did cause a wound to his middle finger and patient did go to the the [facility] in [place].  They did x-rays which were normal and patient was put on an antibiotic as a precaution since he is diabetic.  Patient was advised to follow up with Dr. Renold Genta if he has any problems or needs any follow up care.    Patient is current on his Tdap.  Advised patient of the date:  04/05/2012  Patient states that they are going to send animal control over to take pictures of his hand and I assume, to quarantine the dog.  Patient will call us back if he needs to follow up with [place] for anything further.

## 2017-03-09 ENCOUNTER — Encounter: Payer: Self-pay | Admitting: Internal Medicine

## 2017-03-09 ENCOUNTER — Ambulatory Visit (INDEPENDENT_AMBULATORY_CARE_PROVIDER_SITE_OTHER): Payer: Medicare HMO | Admitting: Internal Medicine

## 2017-03-09 VITALS — BP 160/80 | HR 80 | Temp 98.9°F | Wt 198.0 lb

## 2017-03-09 DIAGNOSIS — E119 Type 2 diabetes mellitus without complications: Secondary | ICD-10-CM

## 2017-03-09 DIAGNOSIS — I10 Essential (primary) hypertension: Secondary | ICD-10-CM | POA: Diagnosis not present

## 2017-03-09 DIAGNOSIS — Z8659 Personal history of other mental and behavioral disorders: Secondary | ICD-10-CM

## 2017-03-09 MED ORDER — METOPROLOL TARTRATE 25 MG PO TABS: 25.0000 mg | ORAL_TABLET | Freq: Two times a day (BID) | ORAL | 3 refills | Status: DC

## 2017-03-09 NOTE — Patient Instructions (Addendum)
Add Lopressor 25 mg twice daily to current regimen. Return in 2-3 weeks for follow-up. Lab work drawn and pending. Flu vaccine received elsewhere.

## 2017-03-09 NOTE — Progress Notes (Signed)
   Subjective:    Patient ID: Reginald Adkins, male    DOB: January 25, 1951, 66 y.o.   MRN: [id number]  HPI   66 year old Male for 6 month recheck. Has already had flu vaccine and Prevnar 13  at another location.  Hx HTN and has had med this am.History of office and labile hypertension. He gets nervous when he comes to a physician's office.  Says he is doing well with his Accu-Cheks and they've been running quite good. Hemoglobin A1c drawn today.    Review of Systems new complaints. History of depression but is on SSRI and says he's not depressed.     Objective:   Physical Exam Neck is supple without JVD thyromegaly or carotid bruits. Chest clear to auscultation. Cardiac exam tachycardia regular rate and rhythm normal S1 and S2. Blood pressure rechecked and it is still elevated at 160/80.       Assessment & Plan:  Essential hypertension  Office hypertension  Controlled type 2 diabetes mellitus  History of depression-stable on SSRI  Plan: Has received flu vaccine elsewhere recently along with Prevnar. Patient will start Lopressor 25 mg twice daily. I talked with him about combining losartan and  HCTZ but  he doesn't really want to do that. He is content to take 2 separate tablets. He will return in 2-3 weeks for follow-up. Hemoglobin A1c drawn today.

## 2017-03-10 LAB — BASIC METABOLIC PANEL WITH GFR
BUN / CREAT RATIO: 17 (calc) (ref 6–22)
BUN: 23 mg/dL (ref 7–25)
CHLORIDE: 101 mmol/L (ref 98–110)
CO2: 27 mmol/L (ref 20–32)
Calcium: 9.5 mg/dL (ref 8.6–10.3)
Creat: 1.33 mg/dL — ABNORMAL HIGH (ref 0.70–1.25)
GFR, EST AFRICAN AMERICAN: 65 mL/min/{1.73_m2} (ref >=60)
GFR, Est Non African American: 56 mL/min/{1.73_m2} — ABNORMAL LOW (ref >=60)
GLUCOSE: 162 mg/dL — AB (ref 65–99)
Potassium: 3.9 mmol/L (ref 3.5–5.3)
SODIUM: 142 mmol/L (ref 135–146)

## 2017-03-10 LAB — HEMOGLOBIN A1C
Hgb A1c MFr Bld: 6.1 % of total Hgb — ABNORMAL HIGH (ref <5.7)
Mean Plasma Glucose: 128 (calc)
eAG (mmol/L): 7.1 (calc)

## 2017-03-10 LAB — MICROALBUMIN / CREATININE URINE RATIO
Creatinine, Urine: 91 mg/dL (ref 20–320)
MICROALB UR: 1.5 mg/dL
MICROALB/CREAT RATIO: 16 ug/mg{creat} (ref <30)

## 2017-03-26 ENCOUNTER — Encounter: Payer: Self-pay | Admitting: Internal Medicine

## 2017-03-26 ENCOUNTER — Ambulatory Visit (INDEPENDENT_AMBULATORY_CARE_PROVIDER_SITE_OTHER): Payer: Medicare HMO | Admitting: Internal Medicine

## 2017-03-26 VITALS — BP 156/92 | HR 67 | Temp 98.4°F | Wt 200.0 lb

## 2017-03-26 DIAGNOSIS — I10 Essential (primary) hypertension: Secondary | ICD-10-CM

## 2017-03-26 NOTE — Progress Notes (Signed)
   Subjective:    Patient ID: Reginald Adkins, male    DOB: [birth year], 66 y.o.   MRN: [id number]  HPI 66 year old Male with essential hypertension and office hypertension. At last visit, BP was elevated and we put him on low-dose Lopressor 25 mg twice daily. He's also on amlodipine, losartan, HCTZ. He says his pulse is been okay. Doesn't seem to get elevated with a lot of exercise which I told him was normal for a beta blocker. He gets tired but that is not unusual. He takes breaks and is able to do yard work just fine. No chest pain. Unfortunately he doesn't have any blood pressure readings from home. Says that in urgent care near him and med been may be able to do some blood pressure checks for him.    Review of Systems says when he pulls up into the parking lot he can tell he gets anxious     Objective:   Physical Exam Neck supple. No thyromegaly JVD or carotid bruits. Chest clear. Cardiac exam regular rate and rhythm. Extremities without edema. Repeat blood pressure check 160/96       Assessment & Plan:  Essential HTN  Office HTN  Plan: No change in blood pressure regimen at the present time. He'll have blood pressure checks at urgent care near his home and call me with results in 4 weeks.

## 2017-03-26 NOTE — Patient Instructions (Signed)
Continue same antihypertensive medications. Have blood pressure readings done at nearby urgent care and call with results in 4 weeks

## 2017-04-01 ENCOUNTER — Other Ambulatory Visit: Payer: Self-pay | Admitting: Internal Medicine

## 2017-04-11 ENCOUNTER — Other Ambulatory Visit: Payer: Self-pay | Admitting: Internal Medicine

## 2017-04-30 ENCOUNTER — Telehealth: Payer: Self-pay

## 2017-04-30 NOTE — Telephone Encounter (Signed)
Per Dr. Lauree Chandler request I called patient to discuss taking a stating medication for the diagnosis of Diabetes. Had to LVM

## 2017-04-30 NOTE — Telephone Encounter (Signed)
Pt declined

## 2017-06-26 ENCOUNTER — Other Ambulatory Visit: Payer: Self-pay | Admitting: Internal Medicine

## 2017-06-29 ENCOUNTER — Other Ambulatory Visit: Payer: Self-pay | Admitting: Internal Medicine

## 2017-07-17 ENCOUNTER — Other Ambulatory Visit: Payer: Self-pay | Admitting: Internal Medicine

## 2017-09-26 ENCOUNTER — Encounter: Payer: Self-pay | Admitting: Internal Medicine

## 2017-09-26 ENCOUNTER — Telehealth: Payer: Self-pay | Admitting: Internal Medicine

## 2017-09-26 NOTE — Telephone Encounter (Signed)
Patient called with vague complaint mid abdominal pain when working below umbilicus. No dysuria constipation or diarrhea. Improved with OTC analgesic med. Does heavy lifting. No BRBPR. No nausea or vomiting.  Pt will call if symptoms worsen. Has CPE in June

## 2017-10-03 ENCOUNTER — Other Ambulatory Visit: Payer: Self-pay | Admitting: Internal Medicine

## 2017-10-29 ENCOUNTER — Other Ambulatory Visit: Payer: Self-pay | Admitting: Internal Medicine

## 2017-11-07 ENCOUNTER — Other Ambulatory Visit: Payer: Self-pay | Admitting: Internal Medicine

## 2017-11-14 ENCOUNTER — Other Ambulatory Visit: Payer: Self-pay

## 2017-11-14 DIAGNOSIS — E119 Type 2 diabetes mellitus without complications: Secondary | ICD-10-CM

## 2017-11-14 DIAGNOSIS — Z125 Encounter for screening for malignant neoplasm of prostate: Secondary | ICD-10-CM

## 2017-11-14 DIAGNOSIS — Z0000 Encounter for general adult medical examination without abnormal findings: Secondary | ICD-10-CM

## 2017-11-14 DIAGNOSIS — I10 Essential (primary) hypertension: Secondary | ICD-10-CM

## 2017-12-03 ENCOUNTER — Other Ambulatory Visit: Payer: 59 | Admitting: Internal Medicine

## 2017-12-03 ENCOUNTER — Ambulatory Visit (INDEPENDENT_AMBULATORY_CARE_PROVIDER_SITE_OTHER): Payer: 59 | Admitting: Internal Medicine

## 2017-12-03 ENCOUNTER — Encounter: Payer: Self-pay | Admitting: Internal Medicine

## 2017-12-03 VITALS — BP 150/90 | HR 68 | Temp 98.2°F | Ht 70.0 in | Wt 202.0 lb

## 2017-12-03 DIAGNOSIS — F325 Major depressive disorder, single episode, in full remission: Secondary | ICD-10-CM

## 2017-12-03 DIAGNOSIS — Z0000 Encounter for general adult medical examination without abnormal findings: Secondary | ICD-10-CM

## 2017-12-03 DIAGNOSIS — Z125 Encounter for screening for malignant neoplasm of prostate: Secondary | ICD-10-CM

## 2017-12-03 DIAGNOSIS — I10 Essential (primary) hypertension: Secondary | ICD-10-CM

## 2017-12-03 DIAGNOSIS — E119 Type 2 diabetes mellitus without complications: Secondary | ICD-10-CM

## 2017-12-03 LAB — POCT URINALYSIS DIPSTICK
Appearance: NORMAL
BILIRUBIN UA: NEGATIVE
GLUCOSE UA: NEGATIVE
KETONES UA: NEGATIVE
Leukocytes, UA: NEGATIVE
Nitrite, UA: NEGATIVE
ODOR: NORMAL
PH UA: 6.5 (ref 5.0–8.0)
Protein, UA: NEGATIVE
RBC UA: NEGATIVE
Spec Grav, UA: 1.015 (ref 1.010–1.025)
UROBILINOGEN UA: 0.2 U/dL

## 2017-12-03 MED ORDER — ATORVASTATIN CALCIUM 10 MG PO TABS: 10.0000 mg | ORAL_TABLET | Freq: Every day | ORAL | 3 refills | Status: DC

## 2017-12-03 NOTE — Progress Notes (Signed)
Subjective:    Patient ID: Reginald Adkins, male    DOB: May 31, 1951, 66 y.o.   MRN: [id number]  HPI 67 year old Male for Medicare wellness, health maintenance exam and evaluation of medical issues.  Says insurance company keeps asking about being on statin medication.  We will place him on Lipitor 10 mg daily. Has slowed down a bit.  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  Feels like he is gained weight but weight has not increased a great deal since last visit.  He has a lot of stress looking after 2 gentleman 1 of whom is his brother with Parkinson's disease.  The other one is a good friend.  Did not take BP med until arrived and BP was elevated.  He will need to return in 2 weeks for blood pressure check.  He needs to take blood pressure medication 2 hours before coming to the office.  Fasting labs are drawn and are pending today. Review of Systems Other than a bit less energy he has no new complaints other than situational stress    Objective:   Physical Exam  Constitutional: He appears well-developed and well-nourished.  HENT:  Head: Normocephalic and atraumatic.  Right Ear: External ear normal.  Left Ear: External ear normal.  Mouth/Throat: Oropharynx is clear and moist. No oropharyngeal exudate.  Eyes: Pupils are equal, round, and reactive to light. EOM are normal. Right eye exhibits no discharge. Left eye exhibits no discharge. No scleral icterus.  Neck: No JVD present. No thyromegaly present.  Cardiovascular: Normal rate, regular rhythm and normal heart sounds.  No murmur heard. Pulmonary/Chest: Effort normal and breath sounds normal. No stridor. No respiratory distress. He has no wheezes. He has no rales.  Abdominal: Soft. He exhibits no distension and no mass. There is no tenderness. There is no rebound and no guarding.  Genitourinary: Prostate normal.  Musculoskeletal: He exhibits no edema.  Lymphadenopathy:    He has no cervical adenopathy.  Neurological: He  displays normal reflexes. No cranial nerve deficit. He exhibits normal muscle tone. Coordination normal.  Skin: Skin is warm and dry. No rash noted.  Psychiatric: He has a normal mood and affect. His behavior is normal. Judgment and thought content normal.   BMI was 28.98 Weight is 202. Labs drawn and pending.  Dr. Gaynelle Arabian asked me to see him in 2013 as he was having significant hyperglycemia issues with nocturia weakness and polyuria.  He is done well since that time.  He was started on metformin at [facility] by Dr. Suzi Roots around 2010.  He has a history in the remote past kidney stones.  He had lithotripsy and left ureteral stent placed in the past.  Remote history of GE reflux.  He does not smoke or consume alcohol.  He has a history of phimosis.  Social history: He is single.  He works as a Surveyor, quantity.  He is a native of [place].     Assessment & Plan:  Controlled type 2 diabetes mellitus-labs drawn and pending.  Stable on metformin.  Situational stress-discussed at length  Essential hypertension-did not take blood pressure medication until he arrived today  History of kidney stones-no recent recurrence  History of depression-currently on treatment and stable  Plan: Return in 2 weeks for repeat blood pressure check and continue same medications.  Adding Lipitor 10 mg daily because he is a diabetic.  Continue benazepril and atenolol for hypertension.  He is on metformin.  Subjective:   Patient presents for Medicare Annual/Subsequent preventive examination.  Review Past Medical/Family/Social: See above   Risk Factors  Current exercise habits: Exercises with yard work which he does for living Dietary issues discussed: Low-fat low carbohydrate discussed at length  Cardiac risk factors: Diabetes mellitus and hypertension  Depression Screen  (Note: if answer to either of the following is "Yes", a more complete depression screening is  indicated)   Over the past two weeks, have you felt down, depressed or hopeless? No  Over the past two weeks, have you felt little interest or pleasure in doing things? No Have you lost interest or pleasure in daily life? No Do you often feel hopeless? No Do you cry easily over simple problems? No   Activities of Daily Living  In your present state of health, do you have any difficulty performing the following activities?:   Driving? No  Managing money? No  Feeding yourself? No  Getting from bed to chair? No  Climbing a flight of stairs? No  Preparing food and eating?: No  Bathing or showering? No  Getting dressed: No  Getting to the toilet? No  Using the toilet:No  Moving around from place to place: No  In the past year have you fallen or had a near fall?:No  Are you sexually active? No  Do you have more than one partner? No   Hearing Difficulties: No  Do you often ask people to speak up or repeat themselves? No  Do you experience ringing or noises in your ears? No  Do you have difficulty understanding soft or whispered voices? No  Do you feel that you have a problem with memory? No Do you often misplace items? No    Home Safety:  Do you have a smoke alarm at your residence? Yes Do you have grab bars in the bathroom?  No Do you have throw rugs in your house?  Yes   Cognitive Testing  Alert? Yes Normal Appearance?Yes  Oriented to person? Yes Place? Yes  Time? Yes  Recall of three objects? Yes  Can perform simple calculations? Yes  Displays appropriate judgment?Yes  Can read the correct time from a watch face?Yes   List the Names of Other Physician/Practitioners you currently use:  See referral list for the physicians patient is currently seeing.     Review of Systems: No new complaints   Objective:     General appearance: Appears stated age and mildly obese  Head: Normocephalic, without obvious abnormality, atraumatic  Eyes: conj clear, EOMi PEERLA    Ears: normal TM's and external ear canals both ears  Nose: Nares normal. Septum midline. Mucosa normal. No drainage or sinus tenderness.  Throat: lips, mucosa, and tongue normal; teeth and gums normal  Neck: no adenopathy, no carotid bruit, no JVD, supple, symmetrical, trachea midline and thyroid not enlarged, symmetric, no tenderness/mass/nodules  No CVA tenderness.  Lungs: clear to auscultation bilaterally  Breasts: normal appearance, no masses or tenderness Heart: regular rate and rhythm, S1, S2 normal, no murmur, click, rub or gallop  Abdomen: soft, non-tender; bowel sounds normal; no masses, no organomegaly  Musculoskeletal: ROM normal in all joints, no crepitus, no deformity, Normal muscle strengthen. Back  is symmetric, no curvature. Skin: Skin color, texture, turgor normal. No rashes or lesions  Lymph nodes: Cervical, supraclavicular, and axillary nodes normal.  Neurologic: CN 2 -12 Normal, Normal symmetric reflexes. Normal coordination and gait  Psych: Alert & Oriented x 3, Mood appear stable.  Assessment:    Annual wellness medicare exam   Plan:    During the course of the visit the patient was educated and counseled about appropriate screening and preventive services including:   Discussed Cologuard with him.  He will consider it.  He declines colonoscopy.     Patient Instructions (the written plan) was given to the patient.  Medicare Attestation  I have personally reviewed:  The patient's medical and social history  Their use of alcohol, tobacco or illicit drugs  Their current medications and supplements  The patient's functional ability including ADLs,fall risks, home safety risks, cognitive, and hearing and visual impairment  Diet and physical activities  Evidence for depression or mood disorders  The patient's weight, height, BMI, and visual acuity have been recorded in the chart. I have made referrals, counseling, and provided education to the patient based  on review of the above and I have provided the patient with a written personalized care plan for preventive services.

## 2017-12-03 NOTE — Patient Instructions (Signed)
Take blood pressure medicine 2 hours before arriving and return in 2 weeks.  Work on diet exercise and lose a bit of weight.  Continue same medications.  We have added Lipitor 10 mg daily to your medication regimen.  Labs pending.  Please have diabetic eye exam.  Cologuard information given.

## 2017-12-04 LAB — PSA: PSA: 0.3 ng/mL (ref <=4.0)

## 2017-12-04 LAB — LIPID PANEL
Cholesterol: 179 mg/dL (ref <200)
HDL: 33 mg/dL — AB (ref >40)
LDL Cholesterol (Calc): 129 mg/dL (calc) — ABNORMAL HIGH
NON-HDL CHOLESTEROL (CALC): 146 mg/dL — AB (ref <130)
TRIGLYCERIDES: 74 mg/dL (ref <150)
Total CHOL/HDL Ratio: 5.4 (calc) — ABNORMAL HIGH (ref <5.0)

## 2017-12-04 LAB — COMPLETE METABOLIC PANEL WITH GFR
AG Ratio: 1.6 (calc) (ref 1.0–2.5)
ALBUMIN MSPROF: 4.6 g/dL (ref 3.6–5.1)
ALT: 19 U/L (ref 9–46)
AST: 18 U/L (ref 10–35)
Alkaline phosphatase (APISO): 55 U/L (ref 40–115)
BILIRUBIN TOTAL: 0.8 mg/dL (ref 0.2–1.2)
BUN / CREAT RATIO: 24 (calc) — AB (ref 6–22)
BUN: 28 mg/dL — ABNORMAL HIGH (ref 7–25)
CHLORIDE: 103 mmol/L (ref 98–110)
CO2: 28 mmol/L (ref 20–32)
Calcium: 9.5 mg/dL (ref 8.6–10.3)
Creat: 1.19 mg/dL (ref 0.70–1.25)
GFR, EST AFRICAN AMERICAN: 73 mL/min/{1.73_m2} (ref >=60)
GFR, EST NON AFRICAN AMERICAN: 63 mL/min/{1.73_m2} (ref >=60)
Globulin: 2.8 g/dL (calc) (ref 1.9–3.7)
Glucose, Bld: 148 mg/dL — ABNORMAL HIGH (ref 65–99)
POTASSIUM: 4.3 mmol/L (ref 3.5–5.3)
SODIUM: 140 mmol/L (ref 135–146)
TOTAL PROTEIN: 7.4 g/dL (ref 6.1–8.1)

## 2017-12-04 LAB — CBC WITH DIFFERENTIAL/PLATELET
BASOS ABS: 40 {cells}/uL (ref 0–200)
Basophils Relative: 0.6 %
EOS ABS: 132 {cells}/uL (ref 15–500)
EOS PCT: 2 %
HCT: 44.6 % (ref 38.5–50.0)
Hemoglobin: 15.8 g/dL (ref 13.2–17.1)
Lymphs Abs: 1657 cells/uL (ref 850–3900)
MCH: 31.7 pg (ref 27.0–33.0)
MCHC: 35.4 g/dL (ref 32.0–36.0)
MCV: 89.6 fL (ref 80.0–100.0)
MONOS PCT: 7.7 %
MPV: 10.5 fL (ref 7.5–12.5)
NEUTROS PCT: 64.6 %
Neutro Abs: 4264 cells/uL (ref 1500–7800)
Platelets: 220 10*3/uL (ref 140–400)
RBC: 4.98 10*6/uL (ref 4.20–5.80)
RDW: 12.7 % (ref 11.0–15.0)
Total Lymphocyte: 25.1 %
WBC mixed population: 508 cells/uL (ref 200–950)
WBC: 6.6 10*3/uL (ref 3.8–10.8)

## 2017-12-04 LAB — HEMOGLOBIN A1C
EAG (MMOL/L): 7.6 (calc)
Hgb A1c MFr Bld: 6.4 % of total Hgb — ABNORMAL HIGH (ref <5.7)
Mean Plasma Glucose: 137 (calc)

## 2017-12-04 LAB — MICROALBUMIN / CREATININE URINE RATIO
CREATININE, URINE: 121 mg/dL (ref 20–320)
MICROALB UR: 2.2 mg/dL
MICROALB/CREAT RATIO: 18 ug/mg{creat} (ref <30)

## 2017-12-11 IMAGING — CR DG FINGER INDEX 2+V*R*
3 series · 3 of 3 positions shown · non-contrast
Comparison: None

CLINICAL DATA: Pain, laceration, dog bite

EXAM:
RIGHT INDEX FINGER 2+V

[finger ap]
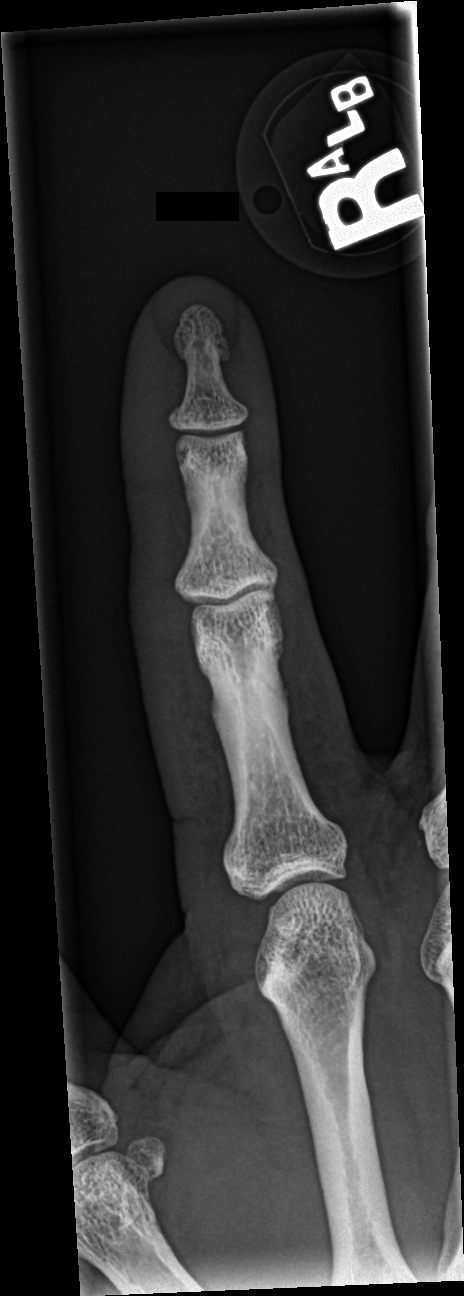

[finger obl]
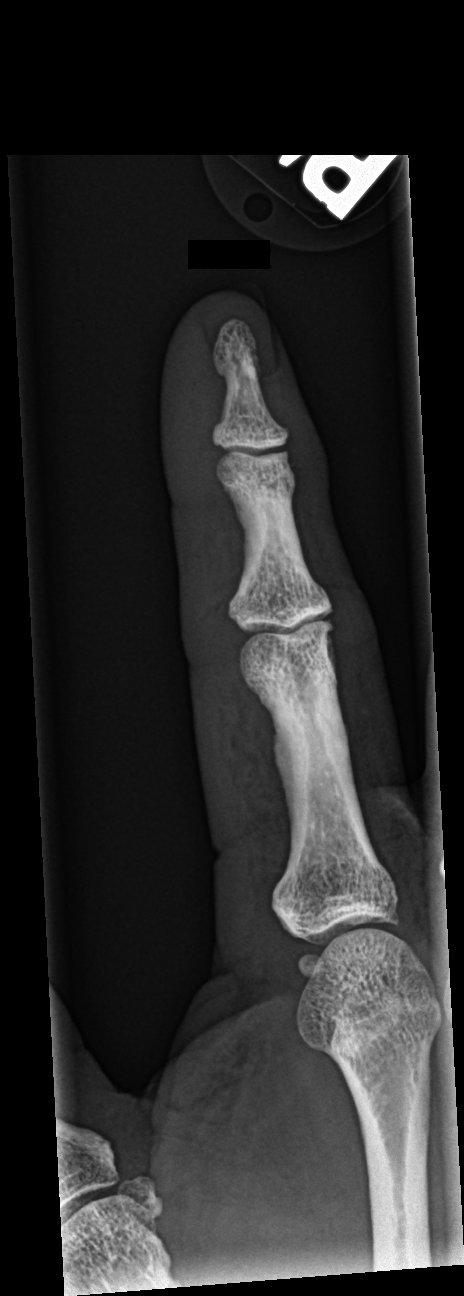

[finger lat]
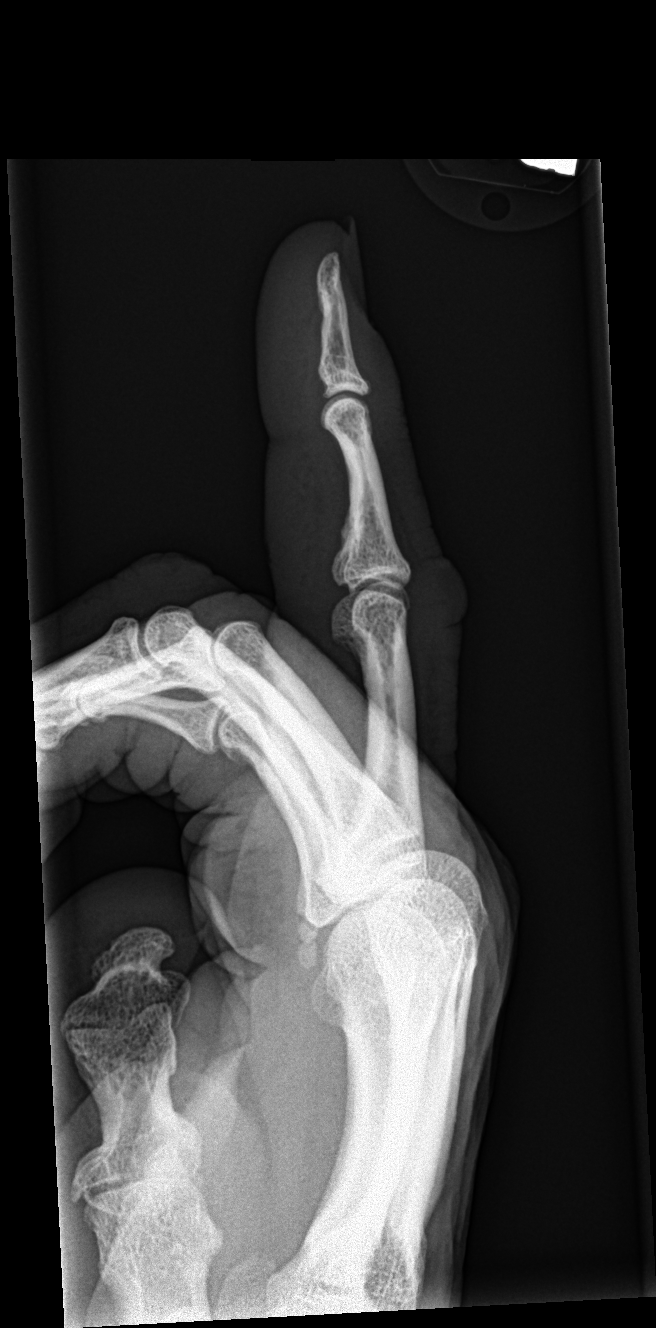

[3 of 3 positions shown; findings below may reference images not displayed]

FINDINGS: No acute bony abnormality. Specifically, no fracture, subluxation,
or dislocation. Soft tissues are intact. No radiopaque foreign body.
IMPRESSION: No acute bony abnormality or radiopaque foreign body.

## 2017-12-17 ENCOUNTER — Ambulatory Visit (INDEPENDENT_AMBULATORY_CARE_PROVIDER_SITE_OTHER): Payer: 59 | Admitting: Internal Medicine

## 2017-12-17 VITALS — BP 130/80 | HR 70

## 2017-12-17 DIAGNOSIS — I10 Essential (primary) hypertension: Secondary | ICD-10-CM | POA: Diagnosis not present

## 2017-12-17 NOTE — Patient Instructions (Signed)
Continue same medications and RTC in 6 months

## 2017-12-17 NOTE — Progress Notes (Signed)
   Subjective:    Patient ID: Reginald Adkins, male    DOB: 24-Mar-1951, 67 y.o.   MRN: [id number]  HPI Pt here for BP check by CMA because of elevated BP at last visit. BP is normal today. No change in medication regimen.    Review of Systems     Objective:   Physical Exam  See BP reading taken by CMA      Assessment & Plan:  Essential HTN  No change in medication at this time. RTC 6 months.

## 2017-12-21 ENCOUNTER — Telehealth: Payer: Self-pay | Admitting: Internal Medicine

## 2017-12-21 NOTE — Telephone Encounter (Addendum)
Faxed Medical records to [place] for 06-12-16 till present (29 pages) on 7.12.19 @ 12:55 pm

## 2018-01-10 ENCOUNTER — Telehealth: Payer: Self-pay | Admitting: Internal Medicine

## 2018-01-10 ENCOUNTER — Encounter: Payer: Self-pay | Admitting: Internal Medicine

## 2018-01-10 MED ORDER — PENICILLIN V POTASSIUM 500 MG PO TABS: 500.0000 mg | ORAL_TABLET | Freq: Four times a day (QID) | ORAL | 0 refills | Status: DC

## 2018-01-10 NOTE — Telephone Encounter (Signed)
We will send him in [facility] but he has to find a dentist to pull the tooth very soon

## 2018-01-10 NOTE — Telephone Encounter (Signed)
Spoke with patient to advise of his Rx and also spoke with patient related to the importance of finding a dentist.  States that he has to find someone in his network.  Suggested that he contact the insurance company by calling the number on his card and have them assist him in locating someone within his network so he can get this taken care of before it starts affecting his health.    Patient verbalized understanding.

## 2018-01-10 NOTE — Telephone Encounter (Signed)
Says that he believes he has an abcessed tooth where the tooth has broken off.  He said that he spoke with you before about it.  He thinks it has gotten infected; says the gum is sore above it.  He wants to know if you will call in an antibiotic for him?  Said you all discussed this previously and he didn't think he needed one at that time, but he does now.    He said his dentist retired and he has to find one within his network.    Pharmacy:  [facility]   Thank you.

## 2018-02-26 ENCOUNTER — Other Ambulatory Visit: Payer: Self-pay | Admitting: Internal Medicine

## 2018-04-02 ENCOUNTER — Other Ambulatory Visit: Payer: Self-pay | Admitting: Internal Medicine

## 2018-04-09 ENCOUNTER — Other Ambulatory Visit: Payer: Self-pay | Admitting: Internal Medicine

## 2018-06-03 ENCOUNTER — Ambulatory Visit: Payer: 59 | Admitting: Internal Medicine

## 2018-06-03 ENCOUNTER — Other Ambulatory Visit: Payer: 59 | Admitting: Internal Medicine

## 2018-06-23 ENCOUNTER — Other Ambulatory Visit: Payer: Self-pay | Admitting: Internal Medicine

## 2018-06-25 ENCOUNTER — Encounter: Payer: Self-pay | Admitting: Internal Medicine

## 2018-06-25 ENCOUNTER — Ambulatory Visit (INDEPENDENT_AMBULATORY_CARE_PROVIDER_SITE_OTHER): Payer: Medicare HMO | Admitting: Internal Medicine

## 2018-06-25 VITALS — BP 160/100 | HR 63 | Temp 98.6°F | Ht 70.0 in | Wt 205.0 lb

## 2018-06-25 DIAGNOSIS — R030 Elevated blood-pressure reading, without diagnosis of hypertension: Secondary | ICD-10-CM

## 2018-06-25 DIAGNOSIS — I10 Essential (primary) hypertension: Secondary | ICD-10-CM | POA: Diagnosis not present

## 2018-06-25 DIAGNOSIS — F411 Generalized anxiety disorder: Secondary | ICD-10-CM | POA: Diagnosis not present

## 2018-06-25 DIAGNOSIS — F439 Reaction to severe stress, unspecified: Secondary | ICD-10-CM

## 2018-06-25 DIAGNOSIS — E7800 Pure hypercholesterolemia, unspecified: Secondary | ICD-10-CM

## 2018-06-25 DIAGNOSIS — E119 Type 2 diabetes mellitus without complications: Secondary | ICD-10-CM | POA: Diagnosis not present

## 2018-06-25 MED ORDER — ALPRAZOLAM 0.25 MG PO TABS: 0.2500 mg | ORAL_TABLET | Freq: Two times a day (BID) | ORAL | 0 refills | Status: DC | PRN

## 2018-06-25 NOTE — Patient Instructions (Signed)
Add Xanax to Zoloft and may take Xanax up to twice daily as needed for anxiety.  Continue other medications as previously prescribed.  Labs drawn and pending.  Follow-up in 4 weeks for repeat blood pressure check.

## 2018-06-25 NOTE — Progress Notes (Signed)
   Subjective:    Patient ID: Reginald Adkins, male    DOB: December 21, 1950, 68 y.o.   MRN: [id number]  HPI 68 year old Male for follow up of HTN and controlled type 2 diabetes mellitus.  Takes Norvasc,metoprolol,HCTZ,losartan and Lipitor 10 mg daily.  Is on Zoloft for anxiety and depression.  Worried about his brother whose health is not good.  Worried about finances.  Blood pressure was elevated when he first arrived today.  Says that he checked Accu-Chek this morning and it was elevated a bit and he was concerned.  He seems to be anxious today here in the office.  Review of Systems     Objective:   Physical Exam  Initially blood pressure was 160/100 and was repeated in each arm and remained at 160/100.  Neck is supple.  No carotid bruits.  Chest clear.  Cardiac exam regular rate and rhythm normal S1 and S2.  Extremities without edema.      Assessment & Plan:  Essential hypertension-blood pressure elevated today but patient is anxious.  He will take Xanax prior to coming to the office at next visit in 4 weeks.  He will continue to take Zoloft for anxiety and depression.  He will take Xanax whenever he feels anxious up to twice daily.  Impaired glucose tolerance-hemoglobin A1c to be checked today  Anxiety depression-continue Zoloft and take Xanax up to twice daily as needed for anxiety on a as needed basis  Situational stress taking care of brother friend and concerned about finances  Hyperlipidemia-fasting lipid panel to be drawn today  Plan: Follow-up in 4 weeks

## 2018-06-26 ENCOUNTER — Other Ambulatory Visit: Payer: Medicare HMO | Admitting: Internal Medicine

## 2018-06-26 DIAGNOSIS — E119 Type 2 diabetes mellitus without complications: Secondary | ICD-10-CM | POA: Diagnosis not present

## 2018-06-26 DIAGNOSIS — E7800 Pure hypercholesterolemia, unspecified: Secondary | ICD-10-CM | POA: Diagnosis not present

## 2018-06-26 LAB — HEPATIC FUNCTION PANEL
AG Ratio: 1.7 (calc) (ref 1.0–2.5)
ALT: 22 U/L (ref 9–46)
AST: 17 U/L (ref 10–35)
Albumin: 4.5 g/dL (ref 3.6–5.1)
Alkaline phosphatase (APISO): 51 U/L (ref 40–115)
BILIRUBIN INDIRECT: 0.6 mg/dL (ref 0.2–1.2)
Bilirubin, Direct: 0.2 mg/dL (ref 0.0–0.2)
GLOBULIN: 2.6 g/dL (ref 1.9–3.7)
TOTAL PROTEIN: 7.1 g/dL (ref 6.1–8.1)
Total Bilirubin: 0.8 mg/dL (ref 0.2–1.2)

## 2018-06-26 LAB — HEMOGLOBIN A1C
Hgb A1c MFr Bld: 6.5 % of total Hgb — ABNORMAL HIGH (ref <5.7)
MEAN PLASMA GLUCOSE: 140 (calc)
eAG (mmol/L): 7.7 (calc)

## 2018-06-26 LAB — LIPID PANEL
Cholesterol: 127 mg/dL (ref <200)
HDL: 31 mg/dL — ABNORMAL LOW (ref >40)
LDL Cholesterol (Calc): 80 mg/dL (calc)
NON-HDL CHOLESTEROL (CALC): 96 mg/dL (ref <130)
TRIGLYCERIDES: 75 mg/dL (ref <150)
Total CHOL/HDL Ratio: 4.1 (calc) (ref <5.0)

## 2018-06-26 LAB — MICROALBUMIN / CREATININE URINE RATIO
CREATININE, URINE: 86 mg/dL (ref 20–320)
MICROALB UR: 4.7 mg/dL
Microalb Creat Ratio: 55 mcg/mg creat — ABNORMAL HIGH (ref <30)

## 2018-06-26 NOTE — Addendum Note (Signed)
Addended by: Mady Haagensen on: 06/26/2018 09:15 AM   Modules accepted: Orders

## 2018-07-15 ENCOUNTER — Other Ambulatory Visit: Payer: Self-pay | Admitting: Internal Medicine

## 2018-07-23 ENCOUNTER — Encounter: Payer: Self-pay | Admitting: Internal Medicine

## 2018-07-23 ENCOUNTER — Ambulatory Visit (INDEPENDENT_AMBULATORY_CARE_PROVIDER_SITE_OTHER): Payer: Medicare HMO | Admitting: Internal Medicine

## 2018-07-23 VITALS — BP 130/80 | HR 76 | Ht 70.0 in | Wt 205.0 lb

## 2018-07-23 DIAGNOSIS — I10 Essential (primary) hypertension: Secondary | ICD-10-CM | POA: Diagnosis not present

## 2018-07-23 NOTE — Progress Notes (Signed)
   Subjective:    Patient ID: Reginald Adkins, male    DOB: Mar 11, 1951, 69 y.o.   MRN: [id number]  HPI  68 year old Male in for BP check and nurse visit. At last visit, noted to be anxious. Has Xanax on hand to take prn bid since that visit for anxiety. Is on Zoloft for depression and anxiety.Is on lopressor, Cozaar, HCTZ and amlodipine for HTN    Review of Systems see above     Objective:   Physical Exam BP today is 130/80       Assessment & Plan:  BP normal today. Seen only by CMA. RTC after June 24 for CPE. Monitor BP at home

## 2018-07-23 NOTE — Patient Instructions (Signed)
Continue same meds and monitor BP at home. CPE due after June 24. Reginald Adkins needs to contact pt as appt is not in [facility]

## 2018-08-27 ENCOUNTER — Other Ambulatory Visit: Payer: Self-pay | Admitting: Internal Medicine

## 2018-09-23 ENCOUNTER — Other Ambulatory Visit: Payer: Self-pay | Admitting: Internal Medicine

## 2018-09-30 ENCOUNTER — Other Ambulatory Visit: Payer: Self-pay | Admitting: Internal Medicine

## 2018-10-14 ENCOUNTER — Other Ambulatory Visit: Payer: Self-pay | Admitting: Internal Medicine

## 2018-11-05 ENCOUNTER — Other Ambulatory Visit: Payer: Self-pay | Admitting: Internal Medicine

## 2018-11-26 ENCOUNTER — Other Ambulatory Visit: Payer: Self-pay | Admitting: Internal Medicine

## 2018-12-02 ENCOUNTER — Other Ambulatory Visit: Payer: Self-pay | Admitting: Internal Medicine

## 2018-12-09 ENCOUNTER — Other Ambulatory Visit: Payer: Self-pay

## 2018-12-09 ENCOUNTER — Ambulatory Visit (INDEPENDENT_AMBULATORY_CARE_PROVIDER_SITE_OTHER): Payer: Medicare HMO | Admitting: Internal Medicine

## 2018-12-09 ENCOUNTER — Encounter: Payer: Self-pay | Admitting: Internal Medicine

## 2018-12-09 VITALS — BP 130/90 | HR 56 | Temp 98.5°F | Ht 70.0 in | Wt 200.0 lb

## 2018-12-09 DIAGNOSIS — Z87442 Personal history of urinary calculi: Secondary | ICD-10-CM | POA: Diagnosis not present

## 2018-12-09 DIAGNOSIS — F325 Major depressive disorder, single episode, in full remission: Secondary | ICD-10-CM

## 2018-12-09 DIAGNOSIS — I10 Essential (primary) hypertension: Secondary | ICD-10-CM | POA: Diagnosis not present

## 2018-12-09 DIAGNOSIS — Z0000 Encounter for general adult medical examination without abnormal findings: Secondary | ICD-10-CM

## 2018-12-09 DIAGNOSIS — F411 Generalized anxiety disorder: Secondary | ICD-10-CM | POA: Diagnosis not present

## 2018-12-09 DIAGNOSIS — F439 Reaction to severe stress, unspecified: Secondary | ICD-10-CM | POA: Diagnosis not present

## 2018-12-09 DIAGNOSIS — H903 Sensorineural hearing loss, bilateral: Secondary | ICD-10-CM

## 2018-12-09 DIAGNOSIS — E119 Type 2 diabetes mellitus without complications: Secondary | ICD-10-CM | POA: Diagnosis not present

## 2018-12-09 LAB — POCT URINALYSIS DIPSTICK
Appearance: NEGATIVE
Bilirubin, UA: NEGATIVE
Blood, UA: NEGATIVE
Glucose, UA: NEGATIVE
Ketones, UA: NEGATIVE
Leukocytes, UA: NEGATIVE
Nitrite, UA: NEGATIVE
Odor: NEGATIVE
Protein, UA: NEGATIVE
Spec Grav, UA: 1.015 (ref 1.010–1.025)
Urobilinogen, UA: 0.2 E.U./dL
pH, UA: 6 (ref 5.0–8.0)

## 2018-12-09 NOTE — Progress Notes (Signed)
Subjective:    Patient ID: Reginald Adkins, male    DOB: [birth year], 68 y.o.   MRN: [id number]  HPI 68 year old Male for Medicare wellness, health maintenance exam and evaluation of medical issues.  Weight is down 3 pounds  from June 2019.  Does some yard work with customers but mostly staying at home.  Goes to the grocery store but wears mask and gloves.  Xanax has helped anxiety.  He took 1 before coming today.  His blood pressure is better than it usually is here.  He is very compliant with his medications.  He has essential hypertension, office hypertension, depression, type 2 diabetes mellitus.  In January 2020 his hemoglobin A1c was 6.5%.  Labs are drawn and pending today.  He generally feels well but worries a lot.  Taking care of a gentleman who is in poor health named [name].  History of left ureteral stone January 2017. Immunizations are up-to-date  Tonsillectomy as a child  Multiple ear surgeries J-tube infections as a child  Family history: Brother died of kidney cancer.  One brother with history of Parkinson's disease.  One brother died at age 8 with diabetes and death was either due to a stroke or an MRI.  He was started on Metformin at [facility] by Dr. Suzi Roots around 2010.  He was noted to be hypertensive around that same time as well.  Remote history of GE reflux.  He has a history of phimosis.  Dr. Gaynelle Arabian asked me to see him in 2013 when he was having issues with hyperglycemia including nocturia, weakness and polyuria.  Social history: Call [facility] today.  He is single.  Works as a Dance movement psychotherapist.     Review of Systems denies polyuria, polydipsia.  No chest pain or shortness of breath.     Objective:   Physical Exam Lost 5 pounds since January.  Blood pressure 130/90 and stable.  This is a good reading for him at the doctor's office.  Blood pressure 130/90 recheck with same result.  Pulse 56 and regular.  Pulse oximetry 98%.  Temperature  98.5 degrees orally weight 200 pounds.  BMI 28.70.  Pleasant and talkative.  Skin warm and dry with suntan.  Nodes none.  TMs and pharynx are clear.  He is wearing a hearing device in the left ear.  Neck is supple without JVD thyromegaly or carotid bruits.  Chest clear to auscultation without rales or wheezing.  Cardiac exam regular rate and rhythm normal S1 and S2.  Abdomen no hepatosplenomegaly masses or tenderness.  Prostate is normal without nodules and is normal size.  No lower extremity edema.  Pulses are present in feet.  No lesions on feet.  Neuro no focal deficits on brief neurological exam.  His affect is normal.       Assessment & Plan:  Type 2 diabetes mellitus-hemoglobin A1c drawn and pending  Essential hypertension-stable on current regimen.  He has an element of office hypertension.  Taking Xanax before coming to the office seems to help that.  History of anxiety and depression maintained on Xanax and Zoloft  History of kidney stones-none recently  Plan: Continue current medications.  Fasting labs drawn and pending.  Recommend annual flu vaccine in the Fall.  Continue to social distance wearing mask and gloves.  Risk of complications from YSAYT-01 score is 4.  Defer colonoscopy for now.  He might be eligible for Cologuard.  Subjective:   Patient presents for Medicare Annual/Subsequent  preventive examination.  Review Past Medical/Family/Social: See above   Risk Factors  Current exercise habits: Very active with yard work Dietary issues discussed: Low-fat low carbohydrate  Cardiac risk factors: Diabetes mellitus  Depression Screen  (Note: if answer to either of the following is "Yes", a more complete depression screening is indicated)   Over the past two weeks, have you felt down, depressed or hopeless? No  Over the past two weeks, have you felt little interest or pleasure in doing things? No Have you lost interest or pleasure in daily life? No Do you often feel  hopeless? No Do you cry easily over simple problems? No   Activities of Daily Living  In your present state of health, do you have any difficulty performing the following activities?:   Driving? No  Managing money? No  Feeding yourself? No  Getting from bed to chair? No  Climbing a flight of stairs? No  Preparing food and eating?: No  Bathing or showering? No  Getting dressed: No  Getting to the toilet? No  Using the toilet:No  Moving around from place to place: No  In the past year have you fallen or had a near fall?:No  Are you sexually active? No  Do you have more than one partner? No   Hearing Difficulties: No  Do you often ask people to speak up or repeat themselves?  yes Do you experience ringing or noises in your ears? No  Do you have difficulty understanding soft or whispered voices?  Yes wears amplifier Do you feel that you have a problem with memory? No Do you often misplace items?  On occasion  Home Safety:  Do you have a smoke alarm at your residence? Yes Do you have grab bars in the bathroom?yes Do you have throw rugs in your house? no   Cognitive Testing  Alert? Yes Normal Appearance?Yes  Oriented to person? Yes Place? Yes  Time? Yes  Recall of three objects? Yes  Can perform simple calculations? Yes  Displays appropriate judgment?Yes  Can read the correct time from a watch face?Yes   List the Names of Other Physician/Practitioners you currently use:  See referral list for the physicians patient is currently seeing.  No specialists currently are seeing him.   Review of Systems: See above   Objective:     General appearance: Appears stated age  Head: Normocephalic, without obvious abnormality, atraumatic  Eyes: conj clear, EOMi PEERLA  Ears: normal TM's and external ear canals both ears  Nose: Nares normal. Septum midline. Mucosa normal. No drainage or sinus tenderness.  Throat: lips, mucosa, and tongue normal; teeth and gums normal  Neck: no  adenopathy, no carotid bruit, no JVD, supple, symmetrical, trachea midline and thyroid not enlarged, symmetric, no tenderness/mass/nodules  No CVA tenderness.  Lungs: clear to auscultation bilaterally  Breasts: normal appearance, no masses or tenderness, top of the pacemaker on left upper chest. Incision well-healed. It is tender.  Heart: regular rate and rhythm, S1, S2 normal, no murmur, click, rub or gallop  Abdomen: soft, non-tender; bowel sounds normal; no masses, no organomegaly  Musculoskeletal: ROM normal in all joints, no crepitus, no deformity, Normal muscle strengthen. Back  is symmetric, no curvature. Skin: Skin color, texture, turgor normal. No rashes or lesions  Lymph nodes: Cervical, supraclavicular, and axillary nodes normal.  Neurologic: CN 2 -12 Normal, Normal symmetric reflexes. Normal coordination and gait  Psych: Alert & Oriented x 3, Mood appear stable.    Assessment:  Annual wellness medicare exam   Plan:    During the course of the visit the patient was educated and counseled about appropriate screening and preventive services including:   Annual flu vaccine  PSA pending     Patient Instructions (the written plan) was given to the patient.  Medicare Attestation  I have personally reviewed:  The patient's medical and social history  Their use of alcohol, tobacco or illicit drugs  Their current medications and supplements  The patient's functional ability including ADLs,fall risks, home safety risks, cognitive, and hearing and visual impairment  Diet and physical activities  Evidence for depression or mood disorders  The patient's weight, height, BMI, and visual acuity have been recorded in the chart. I have made referrals, counseling, and provided education to the patient based on review of the above and I have provided the patient with a written personalized care plan for preventive services.

## 2018-12-09 NOTE — Patient Instructions (Signed)
Labs drawn and pending with further instructions to follow.  Return in 6 months

## 2018-12-10 LAB — COMPLETE METABOLIC PANEL WITH GFR
AG Ratio: 1.7 (calc) (ref 1.0–2.5)
ALT: 20 U/L (ref 9–46)
AST: 21 U/L (ref 10–35)
Albumin: 4.6 g/dL (ref 3.6–5.1)
Alkaline phosphatase (APISO): 53 U/L (ref 35–144)
BUN: 25 mg/dL (ref 7–25)
CO2: 28 mmol/L (ref 20–32)
Calcium: 9.7 mg/dL (ref 8.6–10.3)
Chloride: 103 mmol/L (ref 98–110)
Creat: 1.22 mg/dL (ref 0.70–1.25)
GFR, Est African American: 71 mL/min/{1.73_m2} (ref >=60)
GFR, Est Non African American: 61 mL/min/{1.73_m2} (ref >=60)
Globulin: 2.7 g/dL (calc) (ref 1.9–3.7)
Glucose, Bld: 141 mg/dL — ABNORMAL HIGH (ref 65–99)
Potassium: 4.3 mmol/L (ref 3.5–5.3)
Sodium: 141 mmol/L (ref 135–146)
Total Bilirubin: 1 mg/dL (ref 0.2–1.2)
Total Protein: 7.3 g/dL (ref 6.1–8.1)

## 2018-12-10 LAB — CBC WITH DIFFERENTIAL/PLATELET
Absolute Monocytes: 490 cells/uL (ref 200–950)
Basophils Absolute: 43 cells/uL (ref 0–200)
Basophils Relative: 0.6 %
Eosinophils Absolute: 192 cells/uL (ref 15–500)
Eosinophils Relative: 2.7 %
HCT: 45.8 % (ref 38.5–50.0)
Hemoglobin: 15.8 g/dL (ref 13.2–17.1)
Lymphs Abs: 1931 cells/uL (ref 850–3900)
MCH: 32.3 pg (ref 27.0–33.0)
MCHC: 34.5 g/dL (ref 32.0–36.0)
MCV: 93.7 fL (ref 80.0–100.0)
MPV: 10.4 fL (ref 7.5–12.5)
Monocytes Relative: 6.9 %
Neutro Abs: 4445 cells/uL (ref 1500–7800)
Neutrophils Relative %: 62.6 %
Platelets: 212 10*3/uL (ref 140–400)
RBC: 4.89 10*6/uL (ref 4.20–5.80)
RDW: 12.6 % (ref 11.0–15.0)
Total Lymphocyte: 27.2 %
WBC: 7.1 10*3/uL (ref 3.8–10.8)

## 2018-12-10 LAB — LIPID PANEL
Cholesterol: 116 mg/dL (ref <200)
HDL: 37 mg/dL — ABNORMAL LOW (ref >=40)
LDL Cholesterol (Calc): 65 mg/dL (calc)
Non-HDL Cholesterol (Calc): 79 mg/dL (calc) (ref <130)
Total CHOL/HDL Ratio: 3.1 (calc) (ref <5.0)
Triglycerides: 64 mg/dL (ref <150)

## 2018-12-10 LAB — HEMOGLOBIN A1C
Hgb A1c MFr Bld: 6.9 % of total Hgb — ABNORMAL HIGH (ref <5.7)
Mean Plasma Glucose: 151 (calc)
eAG (mmol/L): 8.4 (calc)

## 2018-12-10 LAB — PSA: PSA: 0.2 ng/mL (ref <=4.0)

## 2019-01-13 ENCOUNTER — Other Ambulatory Visit: Payer: Self-pay | Admitting: Internal Medicine

## 2019-02-03 ENCOUNTER — Other Ambulatory Visit: Payer: Self-pay | Admitting: Internal Medicine

## 2019-02-24 ENCOUNTER — Other Ambulatory Visit: Payer: Self-pay | Admitting: Internal Medicine

## 2019-03-04 ENCOUNTER — Other Ambulatory Visit: Payer: Self-pay | Admitting: Internal Medicine

## 2019-03-05 ENCOUNTER — Other Ambulatory Visit: Payer: Self-pay | Admitting: Internal Medicine

## 2019-03-18 ENCOUNTER — Telehealth: Payer: Self-pay | Admitting: Internal Medicine

## 2019-03-18 ENCOUNTER — Other Ambulatory Visit: Payer: Self-pay

## 2019-03-18 ENCOUNTER — Ambulatory Visit (INDEPENDENT_AMBULATORY_CARE_PROVIDER_SITE_OTHER): Payer: Medicare HMO | Admitting: Internal Medicine

## 2019-03-18 ENCOUNTER — Encounter: Payer: Self-pay | Admitting: Internal Medicine

## 2019-03-18 VITALS — BP 150/96 | HR 105 | Temp 97.3°F | Ht 70.0 in | Wt 198.0 lb

## 2019-03-18 DIAGNOSIS — K209 Esophagitis, unspecified without bleeding: Secondary | ICD-10-CM | POA: Diagnosis not present

## 2019-03-18 DIAGNOSIS — K224 Dyskinesia of esophagus: Secondary | ICD-10-CM

## 2019-03-18 MED ORDER — SUCRALFATE 1 GM/10ML PO SUSP: 1.0000 g | Freq: Three times a day (TID) | ORAL | 0 refills | Status: DC

## 2019-03-18 NOTE — Telephone Encounter (Signed)
Scheduled appointment

## 2019-03-18 NOTE — Progress Notes (Signed)
   Subjective:    Patient ID: Reginald Adkins, male    DOB: 21-Nov-1950, 68 y.o.   MRN: [id number]  HPI 68 year old Male had onset last evening of swallowing difficulties while eating.  Had choking sensation.  Symptoms were made worse by his anxiety.  Today he still having discomfort with swallowing.  Not able to eat very much.  Is very alarmed about this.  Says he has had this in the past and is concerned.    Review of Systems     Objective:   Physical Exam  I sat with him for 20 minutes in the office.  He was given 30 cc p.o. GI cocktail and obtain some relief with symptoms.  Was able to get him to come day old and be less anxious after this medication was given.      Assessment & Plan:  Esophagitis and esophageal spasm  Plan: Given 30 cc p.o. GI cocktail in the office and improved.  Prescribed Carafate slurry to use 4 times daily.

## 2019-03-18 NOTE — Telephone Encounter (Signed)
Suggest OV

## 2019-03-18 NOTE — Patient Instructions (Addendum)
GI cocktail 30 cc by mouth given in office with some relief.  Carafate slurry 1 g p.o. 4 times daily until symptoms improved.  Call if not improving in 24 hours or sooner if worse

## 2019-03-18 NOTE — Telephone Encounter (Addendum)
Reginald Adkins [phone]  Reginald Adkins called to say that yesterday he had and incident like reflux and since then he has been unable to keep anything down, or swallow water or medications. Spit that come up after swallowing is thick.

## 2019-03-31 ENCOUNTER — Other Ambulatory Visit: Payer: Self-pay | Admitting: Internal Medicine

## 2019-04-07 ENCOUNTER — Other Ambulatory Visit: Payer: Self-pay | Admitting: Internal Medicine

## 2019-04-12 NOTE — Progress Notes (Signed)
You may just have to knock it out. It is a mixture of Maalox, Belladonna and Xylocaine that soothes the GI tract. We can't get it anymore from the pharmacy because the Belladonna increased to $200 and they quit making  The cocktail up. Too bad it was good stuff.

## 2019-05-28 ENCOUNTER — Other Ambulatory Visit: Payer: Self-pay | Admitting: Internal Medicine

## 2019-06-10 ENCOUNTER — Encounter: Payer: Self-pay | Admitting: Internal Medicine

## 2019-06-10 ENCOUNTER — Ambulatory Visit (INDEPENDENT_AMBULATORY_CARE_PROVIDER_SITE_OTHER): Payer: Medicare HMO | Admitting: Internal Medicine

## 2019-06-10 ENCOUNTER — Other Ambulatory Visit: Payer: Self-pay

## 2019-06-10 VITALS — BP 120/80 | HR 52 | Temp 98.0°F | Ht 70.0 in | Wt 190.0 lb

## 2019-06-10 DIAGNOSIS — Z8659 Personal history of other mental and behavioral disorders: Secondary | ICD-10-CM | POA: Diagnosis not present

## 2019-06-10 DIAGNOSIS — E1169 Type 2 diabetes mellitus with other specified complication: Secondary | ICD-10-CM

## 2019-06-10 DIAGNOSIS — F325 Major depressive disorder, single episode, in full remission: Secondary | ICD-10-CM

## 2019-06-10 DIAGNOSIS — E119 Type 2 diabetes mellitus without complications: Secondary | ICD-10-CM | POA: Diagnosis not present

## 2019-06-10 DIAGNOSIS — E785 Hyperlipidemia, unspecified: Secondary | ICD-10-CM

## 2019-06-10 DIAGNOSIS — F411 Generalized anxiety disorder: Secondary | ICD-10-CM | POA: Diagnosis not present

## 2019-06-10 DIAGNOSIS — I10 Essential (primary) hypertension: Secondary | ICD-10-CM | POA: Diagnosis not present

## 2019-06-10 NOTE — Progress Notes (Signed)
   Subjective:    Patient ID: Reginald Adkins, male    DOB: July 21, 1950, 69 y.o.   MRN: [id number]  HPI 68 year old Male for follow up of DM and HTN. Has lost some weight. Sees well. Uses reading glasses. Staying safe and social distancing.  At last visit he had an episode of esophageal spasm that was treated with GI cocktail and resolved.  He has not had any further episodes.  Advise defer Shingrix until pandemic is over.    Review of Systems has lost 10 pounds in 6 months feels better     Objective:   Physical Exam  BP 120/80 Pulse 52 Weight 190 pounds. BMI 27.26 T 98 degrees  Skin warm and dry.  Nodes none.  Neck is supple without JVD thyromegaly or carotid bruits.  Chest clear to auscultation.  Cardiac exam regular rate and rhythm normal S1 and S2 without murmurs or gallops.  No lower extremity edema.      Assessment & Plan:  Controlled type 2 diabetes mellitus-hemoglobin A1c drawn today and pending.  Continue Metformin.  Continue diet and exercise regimen.  I am pleased he lost some weight  Essential hypertension-stable on current regimen of amlodipine, HCTZ and metoprolol  History of anxiety-stable with Xanax  Hyperlipidemia treated with Lipitor-lipid panel will be done at next visit  History of depression treated with Zoloft and stable  Plan: He will be seen in June for health maintenance exam and evaluation of medical issues along with fasting labs.  Recommend he defer Shingrix vaccine until pandemic is over.

## 2019-06-10 NOTE — Patient Instructions (Addendum)
It was a pleasure to see today. Hgb AIC drawn and pending. RTC June 2021 for CPE and labs. Continue same meds.

## 2019-06-11 LAB — HEMOGLOBIN A1C
Hgb A1c MFr Bld: 6.3 % of total Hgb — ABNORMAL HIGH (ref <5.7)
Mean Plasma Glucose: 134 (calc)
eAG (mmol/L): 7.4 (calc)

## 2019-07-01 ENCOUNTER — Other Ambulatory Visit: Payer: Self-pay | Admitting: Internal Medicine

## 2019-08-01 ENCOUNTER — Other Ambulatory Visit: Payer: Self-pay | Admitting: Internal Medicine

## 2019-08-25 ENCOUNTER — Other Ambulatory Visit: Payer: Self-pay | Admitting: Internal Medicine

## 2019-09-16 ENCOUNTER — Other Ambulatory Visit: Payer: Self-pay | Admitting: Internal Medicine

## 2019-10-15 ENCOUNTER — Telehealth: Payer: Self-pay | Admitting: Internal Medicine

## 2019-10-15 NOTE — Telephone Encounter (Signed)
LVM to CB and schedule CPE with labs prior due 12/09/2019

## 2019-10-28 ENCOUNTER — Other Ambulatory Visit: Payer: Self-pay | Admitting: Internal Medicine

## 2019-11-25 ENCOUNTER — Other Ambulatory Visit: Payer: Self-pay | Admitting: Internal Medicine

## 2019-12-18 ENCOUNTER — Other Ambulatory Visit: Payer: Self-pay

## 2019-12-18 ENCOUNTER — Other Ambulatory Visit: Payer: Medicare HMO | Admitting: Internal Medicine

## 2019-12-18 DIAGNOSIS — E119 Type 2 diabetes mellitus without complications: Secondary | ICD-10-CM | POA: Diagnosis not present

## 2019-12-18 DIAGNOSIS — Z0000 Encounter for general adult medical examination without abnormal findings: Secondary | ICD-10-CM | POA: Diagnosis not present

## 2019-12-18 DIAGNOSIS — I10 Essential (primary) hypertension: Secondary | ICD-10-CM

## 2019-12-18 DIAGNOSIS — Z8659 Personal history of other mental and behavioral disorders: Secondary | ICD-10-CM | POA: Diagnosis not present

## 2019-12-18 DIAGNOSIS — E785 Hyperlipidemia, unspecified: Secondary | ICD-10-CM

## 2019-12-18 DIAGNOSIS — F325 Major depressive disorder, single episode, in full remission: Secondary | ICD-10-CM | POA: Diagnosis not present

## 2019-12-18 DIAGNOSIS — Z125 Encounter for screening for malignant neoplasm of prostate: Secondary | ICD-10-CM | POA: Diagnosis not present

## 2019-12-18 DIAGNOSIS — R030 Elevated blood-pressure reading, without diagnosis of hypertension: Secondary | ICD-10-CM | POA: Diagnosis not present

## 2019-12-18 DIAGNOSIS — E1169 Type 2 diabetes mellitus with other specified complication: Secondary | ICD-10-CM | POA: Diagnosis not present

## 2019-12-18 DIAGNOSIS — R799 Abnormal finding of blood chemistry, unspecified: Secondary | ICD-10-CM | POA: Diagnosis not present

## 2019-12-18 DIAGNOSIS — H903 Sensorineural hearing loss, bilateral: Secondary | ICD-10-CM | POA: Diagnosis not present

## 2019-12-18 DIAGNOSIS — E7800 Pure hypercholesterolemia, unspecified: Secondary | ICD-10-CM | POA: Diagnosis not present

## 2019-12-19 LAB — CBC WITH DIFFERENTIAL/PLATELET
Absolute Monocytes: 574 cells/uL (ref 200–950)
Basophils Absolute: 49 cells/uL (ref 0–200)
Basophils Relative: 0.7 %
Eosinophils Absolute: 245 cells/uL (ref 15–500)
Eosinophils Relative: 3.5 %
HCT: 45.4 % (ref 38.5–50.0)
Hemoglobin: 15.7 g/dL (ref 13.2–17.1)
Lymphs Abs: 1659 cells/uL (ref 850–3900)
MCH: 32.2 pg (ref 27.0–33.0)
MCHC: 34.6 g/dL (ref 32.0–36.0)
MCV: 93 fL (ref 80.0–100.0)
MPV: 10.6 fL (ref 7.5–12.5)
Monocytes Relative: 8.2 %
Neutro Abs: 4473 cells/uL (ref 1500–7800)
Neutrophils Relative %: 63.9 %
Platelets: 221 10*3/uL (ref 140–400)
RBC: 4.88 10*6/uL (ref 4.20–5.80)
RDW: 12.4 % (ref 11.0–15.0)
Total Lymphocyte: 23.7 %
WBC: 7 10*3/uL (ref 3.8–10.8)

## 2019-12-19 LAB — COMPLETE METABOLIC PANEL WITH GFR
AG Ratio: 1.8 (calc) (ref 1.0–2.5)
ALT: 18 U/L (ref 9–46)
AST: 21 U/L (ref 10–35)
Albumin: 4.7 g/dL (ref 3.6–5.1)
Alkaline phosphatase (APISO): 60 U/L (ref 35–144)
BUN/Creatinine Ratio: 25 (calc) — ABNORMAL HIGH (ref 6–22)
BUN: 28 mg/dL — ABNORMAL HIGH (ref 7–25)
CO2: 29 mmol/L (ref 20–32)
Calcium: 10 mg/dL (ref 8.6–10.3)
Chloride: 104 mmol/L (ref 98–110)
Creat: 1.12 mg/dL (ref 0.70–1.25)
GFR, Est African American: 78 mL/min/{1.73_m2} (ref >=60)
GFR, Est Non African American: 67 mL/min/{1.73_m2} (ref >=60)
Globulin: 2.6 g/dL (calc) (ref 1.9–3.7)
Glucose, Bld: 147 mg/dL — ABNORMAL HIGH (ref 65–99)
Potassium: 4.3 mmol/L (ref 3.5–5.3)
Sodium: 141 mmol/L (ref 135–146)
Total Bilirubin: 1 mg/dL (ref 0.2–1.2)
Total Protein: 7.3 g/dL (ref 6.1–8.1)

## 2019-12-19 LAB — LIPID PANEL
Cholesterol: 117 mg/dL (ref <200)
HDL: 37 mg/dL — ABNORMAL LOW (ref >=40)
LDL Cholesterol (Calc): 68 mg/dL (calc)
Non-HDL Cholesterol (Calc): 80 mg/dL (calc) (ref <130)
Total CHOL/HDL Ratio: 3.2 (calc) (ref <5.0)
Triglycerides: 50 mg/dL (ref <150)

## 2019-12-19 LAB — HEMOGLOBIN A1C
Hgb A1c MFr Bld: 6.8 % of total Hgb — ABNORMAL HIGH (ref <5.7)
Mean Plasma Glucose: 148 (calc)
eAG (mmol/L): 8.2 (calc)

## 2019-12-19 LAB — PSA: PSA: 0.2 ng/mL (ref <=4.0)

## 2019-12-22 ENCOUNTER — Other Ambulatory Visit: Payer: Self-pay

## 2019-12-22 ENCOUNTER — Encounter: Payer: Self-pay | Admitting: Internal Medicine

## 2019-12-22 ENCOUNTER — Ambulatory Visit (INDEPENDENT_AMBULATORY_CARE_PROVIDER_SITE_OTHER): Payer: Medicare HMO | Admitting: Internal Medicine

## 2019-12-22 ENCOUNTER — Telehealth: Payer: Self-pay | Admitting: Internal Medicine

## 2019-12-22 VITALS — BP 140/90 | HR 66 | Ht 70.0 in | Wt 194.0 lb

## 2019-12-22 DIAGNOSIS — E119 Type 2 diabetes mellitus without complications: Secondary | ICD-10-CM | POA: Diagnosis not present

## 2019-12-22 DIAGNOSIS — I10 Essential (primary) hypertension: Secondary | ICD-10-CM

## 2019-12-22 DIAGNOSIS — Z0000 Encounter for general adult medical examination without abnormal findings: Secondary | ICD-10-CM

## 2019-12-22 DIAGNOSIS — E785 Hyperlipidemia, unspecified: Secondary | ICD-10-CM | POA: Diagnosis not present

## 2019-12-22 DIAGNOSIS — Z87442 Personal history of urinary calculi: Secondary | ICD-10-CM | POA: Diagnosis not present

## 2019-12-22 DIAGNOSIS — Z8659 Personal history of other mental and behavioral disorders: Secondary | ICD-10-CM

## 2019-12-22 DIAGNOSIS — E1169 Type 2 diabetes mellitus with other specified complication: Secondary | ICD-10-CM | POA: Diagnosis not present

## 2019-12-22 DIAGNOSIS — H903 Sensorineural hearing loss, bilateral: Secondary | ICD-10-CM | POA: Diagnosis not present

## 2019-12-22 LAB — POCT URINALYSIS DIPSTICK
Appearance: NEGATIVE
Bilirubin, UA: NEGATIVE
Blood, UA: NEGATIVE
Glucose, UA: NEGATIVE
Ketones, UA: NEGATIVE
Leukocytes, UA: NEGATIVE
Nitrite, UA: NEGATIVE
Odor: NEGATIVE
Protein, UA: NEGATIVE
Spec Grav, UA: 1.015 (ref 1.010–1.025)
Urobilinogen, UA: 0.2 E.U./dL
pH, UA: 6.5 (ref 5.0–8.0)

## 2019-12-22 NOTE — Telephone Encounter (Signed)
Mailed letter and [employer] duty form to  [place]:: [place], [place] [phone]

## 2019-12-22 NOTE — Progress Notes (Signed)
Subjective:    Patient ID: Reginald Adkins, male    DOB: 02/26/1951, 69 y.o.   MRN: [id number]  HPI 69 year old Male for Medicare wellness, health maintenance and evaluation of medical issues.  Has received a jury summons. We will write an excuse for him due to hearing issues as we have done previously.  He is anxious about this summons.  He has spent a lot of time at home during the COVID-19 pandemic.  He has animals he takes care off.  When he does go to the grocery store he is careful wearing mask and gloves.  He does some yard work for customers.  He has received 2 COVID-19 immunizations.  He has had both pneumococcal vaccines and his tetanus immunization is up-to-date.  Xanax helps his anxiety.  His blood pressure is usually better if he takes it before coming to the office.  He has essential hypertension, office hypertension, depression, type 2 diabetes mellitus.  He worries a lot.  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  History of left ureteral stone January 2017.  History of dog bite right second and third fingers in 2018.  Dog was reportedly up-to-date on rabies vaccine.  Tonsillectomy as a child.  Multiple ear surgeries as a child.  Dr. Gaynelle Arabian asked me to see him in 2013.  He was having significant hyperglycemia issues with nocturia, weakness, and polyuria.  Family history: 1 brother died at age 53 with diabetes.  Death was either due to a stroke or an MI.  Another brother died with kidney cancer.  Another brother with history of Parkinson's disease.  Social history: Resides with good friend [name] whom he takes care of.  Another close friend is Hollace Kinnier phone # [phone].  Patient does not smoke or consume alcohol.  He was started on Metformin at [facility] by Dr. Suzi Roots around 2010.  He was noted to be hypertensive around that same time as well.  Remote history of GE reflux and history of phimosis.      Review of  Systems  Constitutional: Negative.   Respiratory: Negative.   Cardiovascular: Negative.   Gastrointestinal: Negative.   Musculoskeletal: Negative.   Neurological: Negative.   Psychiatric/Behavioral:       History of anxiety       Objective:   Physical Exam Blood pressure 140/90 pulse 66 pulse oximetry 96% weight 194 pounds BMI 27.84.  He is anxious today regarding jury summons he has received.  Generally his blood pressure is well controlled and he follows it regularly at home. Skin warm and dry.  Nodes none.  Neck is supple without JVD thyromegaly or carotid bruits.  Chest clear to auscultation.  Cardiac exam regular rate and rhythm normal S1 and S2 without murmurs or gallops.  Abdomen soft nondistended without hepatosplenomegaly masses or tenderness.  Prostate is normal without nodules.  No lower extremity edema.  Neuro intact without focal deficits.  Affect thought and judgment are normal but he is slightly anxious      Assessment & Plan:  Type 2 diabetes mellitus-stable.  Hemoglobin A1c 6.8%.  He is on Metformin.  Essential hypertension-stable on current regimen cough amlodipine HCTZ losartan and metoprolol  History of anxiety and depression treated with Xanax and Zoloft  History of kidney stones in the remote past  Low HDL of 37  History of hyperlipidemia with stable lipid panel on statin medication.  Triglycerides, total cholesterol and LDL  are normal.  Plan: Continue current medications and follow-up in 6 months.  I am pleased with his lab results and his diabetic control.  Note written to excuse him from jury duty.  Subjective:   Patient presents for Medicare Annual/Subsequent preventive examination.  Review Past Medical/Family/Social: See above   Risk Factors  Current exercise habits: Gets a lot of exercise about his home and yard as well as doing yard work for others Dietary issues discussed: Low-fat low carbohydrate  Cardiac risk factors: Hyperlipidemia and  diabetes  Depression Screen  (Note: if answer to either of the following is "Yes", a more complete depression screening is indicated)   Over the past two weeks, have you felt down, depressed or hopeless? No  Over the past two weeks, have you felt little interest or pleasure in doing things? No Have you lost interest or pleasure in daily life? No Do you often feel hopeless? No Do you cry easily over simple problems? No   Activities of Daily Living  In your present state of health, do you have any difficulty performing the following activities?:   Driving? No  Managing money? No  Feeding yourself? No  Getting from bed to chair? No  Climbing a flight of stairs? No  Preparing food and eating?: No  Bathing or showering? No  Getting dressed: No  Getting to the toilet? No  Using the toilet:No  Moving around from place to place: No  In the past year have you fallen or had a near fall?:No  Are you sexually active? No  Do you have more than one partner? No   Hearing Difficulties: No  Do you often ask people to speak up or repeat themselves?  Yes Do you experience ringing or noises in your ears?  Yes Do you have difficulty understanding soft or whispered voices?  Yes Do you feel that you have a problem with memory? No Do you often misplace items?  Yes   Home Safety:  Do you have a smoke alarm at your residence? Yes Do you have grab bars in the bathroom?  Yes Do you have throw rugs in your house?  None   Cognitive Testing  Alert? Yes Normal Appearance?Yes  Oriented to person? Yes Place? Yes  Time? Yes  Recall of three objects? Yes  Can perform simple calculations? Yes  Displays appropriate judgment?Yes  Can read the correct time from a watch face?Yes   List the Names of Other Physician/Practitioners you currently use:  See referral list for the physicians patient is currently seeing.     Review of Systems: See above  Objective:     General appearance: Appears stated  age  Head: Normocephalic, without obvious abnormality, atraumatic  Eyes: conj clear, EOMi PEERLA  Ears: normal TM's and external ear canals both ears  Nose: Nares normal. Septum midline. Mucosa normal. No drainage or sinus tenderness.   Neck: no adenopathy, no carotid bruit, no JVD, supple, symmetrical, trachea midline and thyroid not enlarged, symmetric, no tenderness/mass/nodules  No CVA tenderness.  Lungs: clear to auscultation bilaterally  Breasts: normal appearance, no masses or tenderness Heart: regular rate and rhythm, S1, S2 normal, no murmur, click, rub or gallop  Abdomen: soft, non-tender; bowel sounds normal; no masses, no organomegaly  Musculoskeletal: ROM normal in all joints, no crepitus, no deformity, Normal muscle strengthen. Back  is symmetric, no curvature. Skin: Skin color, texture, turgor normal. No rashes or lesions  Lymph nodes: Cervical, supraclavicular, and axillary nodes normal.  Neurologic: CN  2 -12 Normal, Normal symmetric reflexes. Normal coordination and gait  Psych: Alert & Oriented x 3, Mood appear stable.    Assessment:    Annual wellness medicare exam   Plan:    During the course of the visit the patient was educated and counseled about appropriate screening and preventive services including:   PSA  Annual flu vaccine  Has had COVID-19 vaccines     Patient Instructions (the written plan) was given to the patient.  Medicare Attestation  I have personally reviewed:  The patient's medical and social history  Their use of alcohol, tobacco or illicit drugs  Their current medications and supplements  The patient's functional ability including ADLs,fall risks, home safety risks, cognitive, and hearing and visual impairment  Diet and physical activities  Evidence for depression or mood disorders  The patient's weight, height, BMI, and visual acuity have been recorded in the chart. I have made referrals, counseling, and provided education to the  patient based on review of the above and I have provided the patient with a written personalized care plan for preventive services.

## 2020-01-03 ENCOUNTER — Encounter: Payer: Self-pay | Admitting: Internal Medicine

## 2020-01-03 NOTE — Patient Instructions (Signed)
Note written to excuse patient from jury duty.  Continue current medications and follow-up here in 6 months.  It was a pleasure to see you today.

## 2020-01-11 ENCOUNTER — Other Ambulatory Visit: Payer: Self-pay | Admitting: Internal Medicine

## 2020-01-27 ENCOUNTER — Other Ambulatory Visit: Payer: Self-pay | Admitting: Internal Medicine

## 2020-02-24 ENCOUNTER — Other Ambulatory Visit: Payer: Self-pay | Admitting: Internal Medicine

## 2020-03-03 ENCOUNTER — Other Ambulatory Visit: Payer: Self-pay | Admitting: Internal Medicine

## 2020-04-06 ENCOUNTER — Other Ambulatory Visit: Payer: Self-pay | Admitting: Internal Medicine

## 2020-06-22 ENCOUNTER — Other Ambulatory Visit: Payer: Self-pay

## 2020-06-22 ENCOUNTER — Other Ambulatory Visit: Payer: Medicare HMO | Admitting: Internal Medicine

## 2020-06-22 DIAGNOSIS — E119 Type 2 diabetes mellitus without complications: Secondary | ICD-10-CM

## 2020-06-22 DIAGNOSIS — I10 Essential (primary) hypertension: Secondary | ICD-10-CM

## 2020-06-22 DIAGNOSIS — E7800 Pure hypercholesterolemia, unspecified: Secondary | ICD-10-CM

## 2020-06-23 LAB — HEMOGLOBIN A1C
Hgb A1c MFr Bld: 7.1 % of total Hgb — ABNORMAL HIGH (ref <5.7)
Mean Plasma Glucose: 157 mg/dL
eAG (mmol/L): 8.7 mmol/L

## 2020-06-23 LAB — HEPATIC FUNCTION PANEL
AG Ratio: 1.7 (calc) (ref 1.0–2.5)
ALT: 18 U/L (ref 9–46)
AST: 18 U/L (ref 10–35)
Albumin: 4.5 g/dL (ref 3.6–5.1)
Alkaline phosphatase (APISO): 51 U/L (ref 35–144)
Bilirubin, Direct: 0.2 mg/dL (ref 0.0–0.2)
Globulin: 2.6 g/dL (calc) (ref 1.9–3.7)
Indirect Bilirubin: 0.7 mg/dL (calc) (ref 0.2–1.2)
Total Bilirubin: 0.9 mg/dL (ref 0.2–1.2)
Total Protein: 7.1 g/dL (ref 6.1–8.1)

## 2020-06-23 LAB — LIPID PANEL
Cholesterol: 121 mg/dL (ref <200)
HDL: 37 mg/dL — ABNORMAL LOW (ref >=40)
LDL Cholesterol (Calc): 69 mg/dL (calc)
Non-HDL Cholesterol (Calc): 84 mg/dL (calc) (ref <130)
Total CHOL/HDL Ratio: 3.3 (calc) (ref <5.0)
Triglycerides: 69 mg/dL (ref <150)

## 2020-06-24 ENCOUNTER — Encounter: Payer: Self-pay | Admitting: Internal Medicine

## 2020-06-24 ENCOUNTER — Ambulatory Visit (INDEPENDENT_AMBULATORY_CARE_PROVIDER_SITE_OTHER): Payer: Medicare HMO | Admitting: Internal Medicine

## 2020-06-24 ENCOUNTER — Other Ambulatory Visit: Payer: Self-pay

## 2020-06-24 VITALS — BP 120/90 | HR 87 | Ht 70.0 in | Wt 198.0 lb

## 2020-06-24 DIAGNOSIS — Z8659 Personal history of other mental and behavioral disorders: Secondary | ICD-10-CM

## 2020-06-24 DIAGNOSIS — F411 Generalized anxiety disorder: Secondary | ICD-10-CM

## 2020-06-24 DIAGNOSIS — E1169 Type 2 diabetes mellitus with other specified complication: Secondary | ICD-10-CM | POA: Diagnosis not present

## 2020-06-24 DIAGNOSIS — I10 Essential (primary) hypertension: Secondary | ICD-10-CM

## 2020-06-24 DIAGNOSIS — E785 Hyperlipidemia, unspecified: Secondary | ICD-10-CM

## 2020-06-24 NOTE — Progress Notes (Signed)
   Subjective:    Patient ID: Reginald Adkins, male    DOB: March 04, 1951, 70 y.o.   MRN: [id number]  HPI  70 year old Male for 6 month recheck. Hx DM, hyperlipidemia, and HTN.  Hgb AIC is 7.1% it was 6.8% in July.  Patient says he is being careful about COVID-19 exposure and mostly stays at home.  Says he may not have been watching his diet quite as closely recently.  A little depressed by the pandemic and not being able to really go anywhere.  Lipid panel is normal with the exception of a low HDL of 37.  Liver functions are normal.  He is on generic Lipitor 10 mg daily.  For hypertension he is on losartan 100 mg daily, metoprolol 25 mg twice daily, and HCTZ 25 mg daily.  For depression he is on Zoloft 50 mg daily.  He takes Xanax for anxiety and needs refill today.    Review of Systems see above     Objective:   Physical Exam  Blood pressure 120/90, pulse 87 regular pulse oximetry 98% weight 198 pounds.  BMI 28.41.  Skin warm and dry.  No cervical adenopathy.  No carotid bruits.  Chest clear to auscultation.  Cardiac exam: Regular rate and rhythm normal S1 and S2.  No lower extremity pitting edema.      Assessment & Plan:  Type 2 diabetes mellitus with stable hemoglobin A1c 7.1% on metformin only.  Hyperlipidemia treated with Lipitor 10 mg daily with stable lipid panel.  Continue to work on diet and exercise.  Essential hypertension treated with HCTZ, losartan 100 mg daily, amlodipine, and metoprolol.  History of anxiety treated with Xanax.  He is a bit anxious today but affect thought and judgment are normal.  History of depression treated with Zoloft and stable.  Plan: He will continue with current medications and follow-up in 6 months at which time he will have health maintenance exam.

## 2020-06-28 ENCOUNTER — Other Ambulatory Visit: Payer: Self-pay | Admitting: Internal Medicine

## 2020-06-29 ENCOUNTER — Other Ambulatory Visit: Payer: Self-pay | Admitting: Internal Medicine

## 2020-07-03 ENCOUNTER — Encounter: Payer: Self-pay | Admitting: Internal Medicine

## 2020-07-03 NOTE — Patient Instructions (Addendum)
It was a pleasure to see you today.  Continue current medications.  Continue to work with diet and exercise and follow-up in 6 months at which time you will be due for health maintenance exam and fasting labs.  COVID-19 immunizations are up-to-date.

## 2020-07-05 ENCOUNTER — Other Ambulatory Visit: Payer: Self-pay | Admitting: Internal Medicine

## 2020-07-07 ENCOUNTER — Other Ambulatory Visit: Payer: Self-pay | Admitting: Internal Medicine

## 2020-07-13 ENCOUNTER — Telehealth: Payer: Self-pay | Admitting: Internal Medicine

## 2020-07-13 NOTE — Telephone Encounter (Addendum)
[name] [phone]  [name] called to say he is going to be changing all of his prescriptions over to [facility] in [place]. He has already contacted them and given them the information, so we should start getting request from them, if I understood him correctly.

## 2020-10-28 ENCOUNTER — Other Ambulatory Visit: Payer: Self-pay | Admitting: Internal Medicine

## 2020-11-01 ENCOUNTER — Other Ambulatory Visit: Payer: Self-pay

## 2020-11-01 MED ORDER — SERTRALINE HCL 50 MG PO TABS: 1.0000 | ORAL_TABLET | Freq: Every day | ORAL | 3 refills | Status: DC

## 2020-12-20 ENCOUNTER — Other Ambulatory Visit: Payer: Self-pay

## 2020-12-20 ENCOUNTER — Other Ambulatory Visit: Payer: Medicare HMO | Admitting: Internal Medicine

## 2020-12-20 DIAGNOSIS — Z0000 Encounter for general adult medical examination without abnormal findings: Secondary | ICD-10-CM

## 2020-12-20 DIAGNOSIS — E119 Type 2 diabetes mellitus without complications: Secondary | ICD-10-CM | POA: Diagnosis not present

## 2020-12-20 DIAGNOSIS — E1169 Type 2 diabetes mellitus with other specified complication: Secondary | ICD-10-CM | POA: Diagnosis not present

## 2020-12-20 DIAGNOSIS — Z8659 Personal history of other mental and behavioral disorders: Secondary | ICD-10-CM

## 2020-12-20 DIAGNOSIS — I10 Essential (primary) hypertension: Secondary | ICD-10-CM | POA: Diagnosis not present

## 2020-12-20 DIAGNOSIS — E785 Hyperlipidemia, unspecified: Secondary | ICD-10-CM | POA: Diagnosis not present

## 2020-12-20 DIAGNOSIS — H903 Sensorineural hearing loss, bilateral: Secondary | ICD-10-CM

## 2020-12-20 DIAGNOSIS — K224 Dyskinesia of esophagus: Secondary | ICD-10-CM | POA: Diagnosis not present

## 2020-12-20 DIAGNOSIS — F325 Major depressive disorder, single episode, in full remission: Secondary | ICD-10-CM | POA: Diagnosis not present

## 2020-12-20 DIAGNOSIS — Z125 Encounter for screening for malignant neoplasm of prostate: Secondary | ICD-10-CM | POA: Diagnosis not present

## 2020-12-20 DIAGNOSIS — Z87442 Personal history of urinary calculi: Secondary | ICD-10-CM | POA: Diagnosis not present

## 2020-12-21 LAB — COMPLETE METABOLIC PANEL WITH GFR
AG Ratio: 1.7 (calc) (ref 1.0–2.5)
ALT: 21 U/L (ref 9–46)
AST: 19 U/L (ref 10–35)
Albumin: 4.3 g/dL (ref 3.6–5.1)
Alkaline phosphatase (APISO): 57 U/L (ref 35–144)
BUN: 22 mg/dL (ref 7–25)
CO2: 32 mmol/L (ref 20–32)
Calcium: 9.5 mg/dL (ref 8.6–10.3)
Chloride: 99 mmol/L (ref 98–110)
Creat: 1.08 mg/dL (ref 0.70–1.35)
Globulin: 2.6 g/dL (calc) (ref 1.9–3.7)
Glucose, Bld: 136 mg/dL — ABNORMAL HIGH (ref 65–99)
Potassium: 4.4 mmol/L (ref 3.5–5.3)
Sodium: 139 mmol/L (ref 135–146)
Total Bilirubin: 0.7 mg/dL (ref 0.2–1.2)
Total Protein: 6.9 g/dL (ref 6.1–8.1)
eGFR: 74 mL/min/{1.73_m2} (ref >=60)

## 2020-12-21 LAB — CBC WITH DIFFERENTIAL/PLATELET
Absolute Monocytes: 508 cells/uL (ref 200–950)
Basophils Absolute: 39 cells/uL (ref 0–200)
Basophils Relative: 0.5 %
Eosinophils Absolute: 262 cells/uL (ref 15–500)
Eosinophils Relative: 3.4 %
HCT: 43.7 % (ref 38.5–50.0)
Hemoglobin: 15 g/dL (ref 13.2–17.1)
Lymphs Abs: 1555 cells/uL (ref 850–3900)
MCH: 31.6 pg (ref 27.0–33.0)
MCHC: 34.3 g/dL (ref 32.0–36.0)
MCV: 92 fL (ref 80.0–100.0)
MPV: 10.2 fL (ref 7.5–12.5)
Monocytes Relative: 6.6 %
Neutro Abs: 5336 cells/uL (ref 1500–7800)
Neutrophils Relative %: 69.3 %
Platelets: 225 10*3/uL (ref 140–400)
RBC: 4.75 10*6/uL (ref 4.20–5.80)
RDW: 12.4 % (ref 11.0–15.0)
Total Lymphocyte: 20.2 %
WBC: 7.7 10*3/uL (ref 3.8–10.8)

## 2020-12-21 LAB — HEMOGLOBIN A1C
Hgb A1c MFr Bld: 6.9 % of total Hgb — ABNORMAL HIGH (ref <5.7)
Mean Plasma Glucose: 151 mg/dL
eAG (mmol/L): 8.4 mmol/L

## 2020-12-21 LAB — LIPID PANEL
Cholesterol: 116 mg/dL (ref <200)
HDL: 36 mg/dL — ABNORMAL LOW (ref >=40)
LDL Cholesterol (Calc): 66 mg/dL (calc)
Non-HDL Cholesterol (Calc): 80 mg/dL (calc) (ref <130)
Total CHOL/HDL Ratio: 3.2 (calc) (ref <5.0)
Triglycerides: 68 mg/dL (ref <150)

## 2020-12-21 LAB — PSA: PSA: 0.17 ng/mL (ref <=4.00)

## 2020-12-23 ENCOUNTER — Ambulatory Visit (INDEPENDENT_AMBULATORY_CARE_PROVIDER_SITE_OTHER): Payer: Medicare HMO | Admitting: Internal Medicine

## 2020-12-23 ENCOUNTER — Encounter: Payer: Self-pay | Admitting: Internal Medicine

## 2020-12-23 ENCOUNTER — Other Ambulatory Visit: Payer: Self-pay

## 2020-12-23 VITALS — BP 130/90 | HR 70 | Ht 70.0 in | Wt 196.0 lb

## 2020-12-23 DIAGNOSIS — H903 Sensorineural hearing loss, bilateral: Secondary | ICD-10-CM

## 2020-12-23 DIAGNOSIS — I10 Essential (primary) hypertension: Secondary | ICD-10-CM

## 2020-12-23 DIAGNOSIS — E785 Hyperlipidemia, unspecified: Secondary | ICD-10-CM | POA: Diagnosis not present

## 2020-12-23 DIAGNOSIS — Z8659 Personal history of other mental and behavioral disorders: Secondary | ICD-10-CM

## 2020-12-23 DIAGNOSIS — Z0000 Encounter for general adult medical examination without abnormal findings: Secondary | ICD-10-CM | POA: Diagnosis not present

## 2020-12-23 DIAGNOSIS — E1169 Type 2 diabetes mellitus with other specified complication: Secondary | ICD-10-CM | POA: Diagnosis not present

## 2020-12-23 DIAGNOSIS — Z87442 Personal history of urinary calculi: Secondary | ICD-10-CM | POA: Diagnosis not present

## 2020-12-23 DIAGNOSIS — F325 Major depressive disorder, single episode, in full remission: Secondary | ICD-10-CM

## 2020-12-23 LAB — POCT URINALYSIS DIPSTICK
Appearance: NEGATIVE
Bilirubin, UA: NEGATIVE
Blood, UA: NEGATIVE
Glucose, UA: NEGATIVE
Ketones, UA: NEGATIVE
Leukocytes, UA: NEGATIVE
Nitrite, UA: NEGATIVE
Odor: NEGATIVE
Protein, UA: NEGATIVE
Spec Grav, UA: 1.01 (ref 1.010–1.025)
Urobilinogen, UA: 0.2 E.U./dL
pH, UA: 6.5 (ref 5.0–8.0)

## 2020-12-23 MED ORDER — GLIPIZIDE ER 5 MG PO TB24: 5.0000 mg | ORAL_TABLET | Freq: Every day | ORAL | 5 refills | Status: DC

## 2020-12-23 NOTE — Progress Notes (Signed)
Subjective:    Patient ID: Reginald Adkins, male    DOB: 06-Jun-1951, 70 y.o.   MRN: [id number]  HPI 70 year old Male seen for health maintenance exam, Medicare wellness, and evaluation of medical issues.  He has a history of type 2 diabetes mellitus without complication, anxiety depression, hypertension.  He has a history of hyperlipidemia.  Advised to get COVID booster.  Other immunizations are up-to-date.  Social history: He does not smoke or consume alcohol.  Continues to take care of his animals and does work in yard maintenance for a few individuals.  Family history: 1 brother died at age 84 with diabetes.  Death was either due to a stroke or an MI.  Another brother died with kidney cancer.  Another brother with history of Parkinson's disease.  History of left ureteral stone January 2017.  History of dog bite right second and third fingers 2018.  Dog was reportedly up-to-date on rabies vaccine.  Tonsillectomy as a child.  Multiple ear surgeries as a child.  He has a history of essential hypertension, office hypertension, depression, anxiety, type 2 diabetes mellitus.  He worries a lot.  Dr. Gaynelle Arabian asked me to see him in 2013.  He was having significant hyperglycemia issues with nocturia, weakness and polyuria.  History of low HDL.  Lipid panel otherwise is normal.  PSA is normal.  Fasting glucose 136.  CBC is normal.  Hemoglobin A1c 6.9% and was 7.1% in January 2022.  Most recently best hemoglobin A1c was 6.3% in 2020.  We are going to add glipizide XL 5 mg daily with breakfast to his current regimen of metformin.  He will continue with Lipitor.  He will continue with losartan and metoprolol for hypertension.  He also takes HCTZ.      Review of Systems see above-has done well during the pandemic and has not had COVID-19    Objective:   Physical Exam Blood pressure 130/90 pulse 70 pulse oximetry 98% weight 196 pounds BMI 28.12 height 5 feet 10 inches  Skin: Warm and dry.  No  cervical adenopathy.  No carotid bruits.  No thyromegaly.  Chest is clear to auscultation.  Cardiac exam: Regular rate and rhythm.  Abdomen is soft nondistended without hepatosplenomegaly masses or tenderness.  No lower extremity edema.  Neuro is intact without focal deficits.  Affect thought and judgment appear to be normal.       Assessment & Plan:  Type 2 diabetes mellitus-I believe he could have improved control on glipizide XL 5 mg daily and will continue with metformin.  Essential hypertension stable on losartan HCTZ and amlodipine  Hyperlipidemia treated with Lipitor  He has a history of mild hearing loss  He has a history of kidney stones  History of depression treated with Zoloft 50 mg daily and stable  Plan: New medicine is glipizide XL 5 mg daily and he will return in 6 weeks for hemoglobin A1c without office visit.  No change in antihypertensive medication.   Subjective:   Patient presents for Medicare Annual/Subsequent preventive examination.  Review Past Medical/Family/Social: See above   Risk Factors  Current exercise habits: Plenty of exercise with his work and around his house outside Dietary issues discussed: Yes  Cardiac risk factors: Diabetes and hyperlipidemia  Depression Screen  (Note: if answer to either of the following is "Yes", a more complete depression screening is indicated)   Over the past two weeks, have you felt down, depressed or hopeless? No  Over the  past two weeks, have you felt little interest or pleasure in doing things? No Have you lost interest or pleasure in daily life?  Yes due to the pandemic Do you often feel hopeless? No Do you cry easily over simple problems? No   Activities of Daily Living  In your present state of health, do you have any difficulty performing the following activities?:   Driving? No  Managing money? No  Feeding yourself? No  Getting from bed to chair? No  Climbing a flight of stairs? No  Preparing food  and eating?: No  Bathing or showering? No  Getting dressed: No  Getting to the toilet? No  Using the toilet:No  Moving around from place to place: No  In the past year have you fallen or had a near fall?:No  Are you sexually active? No  Do you have more than one partner? No   Hearing Difficulties: No  Do you often ask people to speak up or repeat themselves?  Yes Do you experience ringing or noises in your ears?  Yes Do you have difficulty understanding soft or whispered voices?  Yes Do you feel that you have a problem with memory? No Do you often misplace items? No    Home Safety:  Do you have a smoke alarm at your residence? Yes Do you have grab bars in the bathroom?  Yes Do you have throw rugs in your house?  No   Cognitive Testing  Alert? Yes Normal Appearance?Yes  Oriented to person? Yes Place? Yes  Time? Yes  Recall of three objects? Yes  Can perform simple calculations? Yes  Displays appropriate judgment?Yes  Can read the correct time from a watch face?Yes   List the Names of Other Physician/Practitioners you currently use:  See referral list for the physicians patient is currently seeing.     Review of Systems: See above  Objective:     General appearance: Appears stated age Head: Normocephalic, without obvious abnormality, atraumatic  Eyes: conj clear, EOMi PEERLA  Ears: normal TM's and external ear canals both ears  Nose: Nares normal. Septum midline. Mucosa normal. No drainage or sinus tenderness.  Throat: lips, mucosa, and tongue normal; teeth and gums normal  Neck: no adenopathy, no carotid bruit, no JVD, supple, symmetrical, trachea midline and thyroid not enlarged, symmetric, no tenderness/mass/nodules  No CVA tenderness.  Lungs: clear to auscultation bilaterally  Breasts: normal appearance, no masses or tenderness Heart: regular rate and rhythm, S1, S2 normal, no murmur, click, rub or gallop  Abdomen: soft, non-tender; bowel sounds normal; no  masses, no organomegaly  Musculoskeletal: ROM normal in all joints, no crepitus, no deformity, Normal muscle strengthen. Back  is symmetric, no curvature. Skin: Skin color, texture, turgor normal. No rashes or lesions  Lymph nodes: Cervical, supraclavicular, and axillary nodes normal.  Neurologic: CN 2 -12 Normal, Normal symmetric reflexes. Normal coordination and gait  Psych: Alert & Oriented x 3, Mood appear stable.    Assessment:    Annual wellness medicare exam   Plan:    During the course of the visit the patient was educated and counseled about appropriate screening and preventive services including:   See above     Patient Instructions (the written plan) was given to the patient.  Medicare Attestation  I have personally reviewed:  The patient's medical and social history  Their use of alcohol, tobacco or illicit drugs  Their current medications and supplements  The patient's functional ability including ADLs,fall risks, home safety  risks, cognitive, and hearing and visual impairment  Diet and physical activities  Evidence for depression or mood disorders  The patient's weight, height, BMI, and visual acuity have been recorded in the chart. I have made referrals, counseling, and provided education to the patient based on review of the above and I have provided the patient with a written personalized care plan for preventive services.

## 2020-12-23 NOTE — Patient Instructions (Addendum)
Start Glipizide and RTC for Hgb AIC in 6 weeks. Continue metformin.  Recommend COVID booster.  Continue other medications as previously prescribed.

## 2021-01-20 ENCOUNTER — Telehealth: Payer: Self-pay | Admitting: Internal Medicine

## 2021-01-20 NOTE — Telephone Encounter (Signed)
Reginald Adkins  [phone]  [name] called to say he has drainage that was first clear but now is yellow, no fever that started on Saturday. I had him do COVID test it is negative. Has had 2 vaccines and 1 booster.

## 2021-01-20 NOTE — Telephone Encounter (Signed)
Scheduled

## 2021-01-21 ENCOUNTER — Other Ambulatory Visit: Payer: Self-pay

## 2021-01-21 ENCOUNTER — Ambulatory Visit (INDEPENDENT_AMBULATORY_CARE_PROVIDER_SITE_OTHER): Payer: Medicare HMO | Admitting: Internal Medicine

## 2021-01-21 VITALS — BP 130/90 | HR 63 | Temp 98.3°F | Ht 70.0 in | Wt 196.0 lb

## 2021-01-21 DIAGNOSIS — F411 Generalized anxiety disorder: Secondary | ICD-10-CM

## 2021-01-21 DIAGNOSIS — J069 Acute upper respiratory infection, unspecified: Secondary | ICD-10-CM | POA: Diagnosis not present

## 2021-01-21 DIAGNOSIS — E119 Type 2 diabetes mellitus without complications: Secondary | ICD-10-CM | POA: Diagnosis not present

## 2021-01-21 DIAGNOSIS — I10 Essential (primary) hypertension: Secondary | ICD-10-CM

## 2021-01-21 DIAGNOSIS — Z8659 Personal history of other mental and behavioral disorders: Secondary | ICD-10-CM

## 2021-01-21 DIAGNOSIS — R0989 Other specified symptoms and signs involving the circulatory and respiratory systems: Secondary | ICD-10-CM | POA: Diagnosis not present

## 2021-01-21 MED ORDER — GLIPIZIDE ER 5 MG PO TB24: 5.0000 mg | ORAL_TABLET | Freq: Every day | ORAL | 5 refills | Status: DC

## 2021-01-21 MED ORDER — AMOXICILLIN 500 MG PO CAPS: 500.0000 mg | ORAL_CAPSULE | Freq: Three times a day (TID) | ORAL | 0 refills | Status: AC

## 2021-01-22 LAB — SARS-COV-2 RNA,(COVID-19) QUALITATIVE NAAT: SARS CoV2 RNA: NOT DETECTED

## 2021-01-26 ENCOUNTER — Telehealth: Payer: Self-pay | Admitting: Internal Medicine

## 2021-01-26 NOTE — Telephone Encounter (Signed)
Reginald Adkins [phone]  [name] called to say has been itching for a couple of days on back, chest arms, head. Nurse friend told him it could be antibiotic.

## 2021-01-26 NOTE — Telephone Encounter (Signed)
After talking with Dr Renold Genta she said to stop antibiotic and call back if the itching doesn't stop in a few days. Can also take benadryl. He verbalized understanding

## 2021-01-30 ENCOUNTER — Encounter: Payer: Self-pay | Admitting: Internal Medicine

## 2021-01-30 MED ORDER — ALPRAZOLAM 0.25 MG PO TABS: 0.2500 mg | ORAL_TABLET | Freq: Two times a day (BID) | ORAL | 0 refills | Status: DC | PRN

## 2021-01-30 NOTE — Patient Instructions (Addendum)
Rest at home and quarantine until PCR test is back for COVID-19.  Drink plenty of fluids.  Take Amoxicillin 500 mg 3 times a day for 10 days.  Take Xanax sparingly for anxiety

## 2021-01-30 NOTE — Progress Notes (Signed)
   Subjective:    Patient ID: Reginald Adkins, male    DOB: November 30, 1950, 70 y.o.   MRN: [id number]  HPI 70 year old Male here with nasal congestion.He does do yard work. No significant history of allergy symptoms. Symptoms started Saturday, August 6th.  He took a home COVID test and it was negative today.  He has had 3 COVID vaccines.  Denies fever.  Initially nasal discharge was clear but now has turned yellow and he is concerned.    He has a history of anxiety and depression treated with Zoloft.  History of hypertension treated with losartan, metoprolol, amlodipine and HCTZ.  History of hyperlipidemia treated with Lipitor 10 mg daily.  History of impaired glucose tolerance treated with metformin and glipizide.  Hemoglobin A1c in July was 6.9%.  In January 2022 and hemoglobin A1c was 7.1%.    He has not had COVID-19 virus infection that he knows of.  He had annual Medicare wellness visit here in July 2022.  No travel history.  He wears his mask when in stores.  COVID-19 PCR test done today and proved to be negative  Review of Systems see above no headache, nausea vomiting, fever or chills.  No sore throat.     Objective:   Physical Exam Blood pressure 130/90 pulse 63 regular temperature 98.3 degrees pulse oximetry 97% weight 196 pounds BMI 28.12  Skin: Warm and dry.  TMs are clear.  Pharynx is slightly injected without exudate.  Neck is supple.  Chest clear to auscultation.  No cervical adenopathy.  He is anxious about possibility of having COVID-19.     Assessment & Plan:   Anxiety depression-he is on Zoloft.  Gave him prescription today for Xanax 0.5 mg to take up to twice daily as needed for anxiety  Acute upper respiratory infection-COVID PCR test proved to be negative.  Patient treated with amoxicillin 500 mg 3 times a day for 10 days.  Rest and drink fluids.  Essential hypertension treated with 4 drug regimen  Impaired glucose tolerance treated with metformin and glipizide

## 2021-02-02 ENCOUNTER — Other Ambulatory Visit: Payer: Self-pay | Admitting: Internal Medicine

## 2021-02-02 NOTE — Telephone Encounter (Signed)
Reginald Adkins [phone]  Reginald Adkins called to say he needs below medication sent to below pharmacy. He does not want anything else sent to [facility], please remove from his record.   ALPRAZolam (XANAX) 0.25 MG tablet   [facility] [id number] - [place], [place] [address] AT [address] Phone:  [phone]  Fax:  [phone]

## 2021-02-03 MED ORDER — ALPRAZOLAM 0.25 MG PO TABS: 0.2500 mg | ORAL_TABLET | Freq: Two times a day (BID) | ORAL | 5 refills | Status: DC | PRN

## 2021-02-03 NOTE — Telephone Encounter (Signed)
RX pended please sign.

## 2021-02-08 ENCOUNTER — Other Ambulatory Visit: Payer: Medicare HMO | Admitting: Internal Medicine

## 2021-02-08 ENCOUNTER — Other Ambulatory Visit: Payer: Self-pay

## 2021-02-08 DIAGNOSIS — E119 Type 2 diabetes mellitus without complications: Secondary | ICD-10-CM | POA: Diagnosis not present

## 2021-02-09 LAB — HEMOGLOBIN A1C
Hgb A1c MFr Bld: 6.3 % of total Hgb — ABNORMAL HIGH (ref <5.7)
Mean Plasma Glucose: 134 mg/dL
eAG (mmol/L): 7.4 mmol/L

## 2021-02-10 NOTE — Progress Notes (Signed)
Left detailed message.   

## 2021-02-25 ENCOUNTER — Other Ambulatory Visit: Payer: Self-pay | Admitting: Internal Medicine

## 2021-03-05 ENCOUNTER — Other Ambulatory Visit: Payer: Self-pay | Admitting: Internal Medicine

## 2021-03-16 ENCOUNTER — Ambulatory Visit (INDEPENDENT_AMBULATORY_CARE_PROVIDER_SITE_OTHER): Payer: Medicare HMO | Admitting: Internal Medicine

## 2021-03-16 ENCOUNTER — Other Ambulatory Visit: Payer: Self-pay

## 2021-03-16 DIAGNOSIS — Z23 Encounter for immunization: Secondary | ICD-10-CM | POA: Diagnosis not present

## 2021-03-16 NOTE — Progress Notes (Signed)
Patient presented for flu vaccine. VIS presented prior to if required. No adverse reactions noted.

## 2021-05-19 ENCOUNTER — Other Ambulatory Visit: Payer: Self-pay | Admitting: Internal Medicine

## 2021-06-21 ENCOUNTER — Other Ambulatory Visit: Payer: Self-pay | Admitting: Internal Medicine

## 2021-06-24 ENCOUNTER — Other Ambulatory Visit: Payer: Medicare HMO | Admitting: Internal Medicine

## 2021-06-24 ENCOUNTER — Other Ambulatory Visit: Payer: Self-pay

## 2021-06-24 DIAGNOSIS — E1169 Type 2 diabetes mellitus with other specified complication: Secondary | ICD-10-CM

## 2021-06-24 DIAGNOSIS — E785 Hyperlipidemia, unspecified: Secondary | ICD-10-CM | POA: Diagnosis not present

## 2021-06-24 DIAGNOSIS — E119 Type 2 diabetes mellitus without complications: Secondary | ICD-10-CM | POA: Diagnosis not present

## 2021-06-24 DIAGNOSIS — I10 Essential (primary) hypertension: Secondary | ICD-10-CM | POA: Diagnosis not present

## 2021-06-25 LAB — HEMOGLOBIN A1C
Hgb A1c MFr Bld: 6.2 % of total Hgb — ABNORMAL HIGH (ref <5.7)
Mean Plasma Glucose: 131 mg/dL
eAG (mmol/L): 7.3 mmol/L

## 2021-06-25 LAB — LIPID PANEL
Cholesterol: 122 mg/dL (ref <200)
HDL: 36 mg/dL — ABNORMAL LOW (ref >=40)
LDL Cholesterol (Calc): 73 mg/dL (calc)
Non-HDL Cholesterol (Calc): 86 mg/dL (calc) (ref <130)
Total CHOL/HDL Ratio: 3.4 (calc) (ref <5.0)
Triglycerides: 57 mg/dL (ref <150)

## 2021-06-25 LAB — HEPATIC FUNCTION PANEL
AG Ratio: 1.7 (calc) (ref 1.0–2.5)
ALT: 21 U/L (ref 9–46)
AST: 21 U/L (ref 10–35)
Albumin: 4.4 g/dL (ref 3.6–5.1)
Alkaline phosphatase (APISO): 55 U/L (ref 35–144)
Bilirubin, Direct: 0.2 mg/dL (ref 0.0–0.2)
Globulin: 2.6 g/dL (calc) (ref 1.9–3.7)
Indirect Bilirubin: 0.5 mg/dL (calc) (ref 0.2–1.2)
Total Bilirubin: 0.7 mg/dL (ref 0.2–1.2)
Total Protein: 7 g/dL (ref 6.1–8.1)

## 2021-06-27 ENCOUNTER — Ambulatory Visit (INDEPENDENT_AMBULATORY_CARE_PROVIDER_SITE_OTHER): Payer: Medicare HMO | Admitting: Internal Medicine

## 2021-06-27 ENCOUNTER — Encounter: Payer: Self-pay | Admitting: Internal Medicine

## 2021-06-27 ENCOUNTER — Other Ambulatory Visit: Payer: Self-pay

## 2021-06-27 VITALS — BP 148/88 | HR 68 | Temp 98.1°F | Ht 70.0 in | Wt 200.0 lb

## 2021-06-27 DIAGNOSIS — Z1213 Encounter for screening for malignant neoplasm of small intestine: Secondary | ICD-10-CM | POA: Diagnosis not present

## 2021-06-27 DIAGNOSIS — I10 Essential (primary) hypertension: Secondary | ICD-10-CM | POA: Diagnosis not present

## 2021-06-27 DIAGNOSIS — R195 Other fecal abnormalities: Secondary | ICD-10-CM | POA: Diagnosis not present

## 2021-06-27 DIAGNOSIS — E785 Hyperlipidemia, unspecified: Secondary | ICD-10-CM | POA: Diagnosis not present

## 2021-06-27 DIAGNOSIS — Z1211 Encounter for screening for malignant neoplasm of colon: Secondary | ICD-10-CM

## 2021-06-27 DIAGNOSIS — E1169 Type 2 diabetes mellitus with other specified complication: Secondary | ICD-10-CM

## 2021-06-27 DIAGNOSIS — F411 Generalized anxiety disorder: Secondary | ICD-10-CM | POA: Diagnosis not present

## 2021-06-27 DIAGNOSIS — Z8659 Personal history of other mental and behavioral disorders: Secondary | ICD-10-CM

## 2021-06-27 DIAGNOSIS — F439 Reaction to severe stress, unspecified: Secondary | ICD-10-CM

## 2021-06-27 MED ORDER — ALPRAZOLAM 0.25 MG PO TABS: 0.2500 mg | ORAL_TABLET | Freq: Three times a day (TID) | ORAL | 5 refills | Status: DC | PRN

## 2021-06-27 NOTE — Addendum Note (Signed)
Addended by: Angus Seller on: 06/27/2021 02:19 PM   Modules accepted: Orders

## 2021-06-27 NOTE — Patient Instructions (Addendum)
Prefers Cologard to colonoscopy. This will be ordered. BP elevated today. Is anxious and worries about Covid. Continues to wear mask. Keep BP readings for a few days and call me. He is reluctant to change meds at this time.

## 2021-06-27 NOTE — Progress Notes (Signed)
Subjective:    Patient ID: Reginald Adkins, male    DOB: Sep 12, 1950, 71 y.o.   MRN: [id number]  HPI 71 year old Male for 6 month recheck. Has a lot of worries. Has vehicle issues that need repairing. Is looking at having dental implants.  Wants Cologard instead of colonoscopy. He received a healthy food card,  [employer] card, as well a gas card from [facility].  He has not had COVID-19 virus infection.  He has had at least 3 immunizations.  His tetanus immunization is due this year but Medicare will not pay for it unless he injures himself and it is an expensive immunization.  If he has a cut or scrape we can certainly give him 1 at that time.  He has had both Prevnar 13 and pneumococcal 23 vaccines.  He had a flu vaccine October 5.  He has type 2 diabetes mellitus without complication, anxiety depression, hypertension and hyperlipidemia.  He is compliant with his medications.  Social history: He does not smoke or consume alcohol.  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  History of office hypertension and blood pressure is elevated today.  He worries a lot.  I have increased his Xanax to 0.25 mg from 2 times daily to 3 times daily as needed.  I think he would benefit from this for his anxiety disorder.   His blood pressure is elevated today.  I checked it several times and it remained 148/88 for the most part.  He will check it at least once weekly at home    Review of Systems see above-worried about getting his truck repaired and the dental implants     Objective:   Physical Exam  BP elevated today at 148/88 and was rechecked several times.  He is anxious.  Pulse 68 regular temperature 98.1 degrees pulse oximetry 99% weight 200 pounds.  Skin: Warm and dry.  Nodes none.  Neck is supple without JVD thyromegaly or carotid bruits.  Chest is clear to auscultation.  No lower extremity edema.  Cardiac exam: Regular rate and rhythm without ectopy.       Assessment & Plan:    Anxiety state-she is worried about various life issues including dental implants and getting his vehicles repaired.  Increase Xanax to 0.25 mg up to 3 times daily as needed.  Continue Zoloft 50 mg daily.  A close friend died recently and he is upset.  Elevated blood pressure reading-I think this is related to anxiety today.  He will monitor his blood pressure at home and call with readings.  He really does not want to change his antihypertensive regimen today.  He remains on amlodipine 5 mg daily, losartan 100 mg daily metoprolol 25 mg twice daily and HCTZ 25 mg daily.  One consideration would be changing losartan to Benicar.  Increasing amlodipine would run the risk of lower extremity edema.  We could increase metoprolol from 25 to 50 mg but he is quite active and I do not want to make him fatigued.  Since he does a lot of yard work I do not think he needs to be on Lasix which could cause some volume depletion particularly in the summer when he is busy with yard work.  Type 2 diabetes mellitus-hemoglobin A1c stable at 6.2%  Hyperlipidemia-he has low HDL of 36 but total cholesterol triglycerides and LDL are all normal on statin medication-Lipitor generic 10 mg daily.  Healthcare maintenance he prefers Cologuard to colonoscopy.  He will keep blood pressure readings for a few days and call me with those results.  Health maintenance exam is due in July.

## 2021-06-28 ENCOUNTER — Telehealth: Payer: Self-pay | Admitting: Internal Medicine

## 2021-06-28 NOTE — Telephone Encounter (Signed)
Patient notified. He agrees with plan.

## 2021-06-28 NOTE — Telephone Encounter (Signed)
Reginald Adkins [phone]  Kara called to say his blood pressure is elevated this morning and he is worried it was 175/102 - 160/98 he took a xanax last night and this morning he is in a panic, he went to drug store to take it and it is elevated, he wanted to know if he show drive up here. I told him to try and calm down, you were not here yet. I would talk with you and call him back. He said he was to let you know on Friday but he could not wait until then.

## 2021-07-01 ENCOUNTER — Telehealth: Payer: Self-pay

## 2021-07-01 DIAGNOSIS — Z1211 Encounter for screening for malignant neoplasm of colon: Secondary | ICD-10-CM | POA: Diagnosis not present

## 2021-07-01 NOTE — Telephone Encounter (Signed)
HE took his p today before meds and it was 148/86 with a pulse of 70. He has gotten his medications and will increase aniety meds. 130s was lowest diastolic so far. He is working on loosing 45 pounds.

## 2021-07-01 NOTE — Telephone Encounter (Signed)
Detailed message left on identifiable machine belonging to the patient. Advised to call with any questions or concerns.   

## 2021-07-01 NOTE — Telephone Encounter (Signed)
Notified to call in 2 weeks with more bp readings.

## 2021-07-05 ENCOUNTER — Other Ambulatory Visit: Payer: Self-pay | Admitting: Internal Medicine

## 2021-07-07 NOTE — Telephone Encounter (Signed)
Patient called back to say his blood pressure is doing better 138/80 pulse 68 and 148/88 pulse 70, so it is coming down he will continue to monitor it.

## 2021-07-08 LAB — COLOGUARD: COLOGUARD: POSITIVE — AB

## 2021-07-11 NOTE — Addendum Note (Signed)
Addended by: Angus Seller on: 07/11/2021 11:57 AM   Modules accepted: Orders

## 2021-07-12 ENCOUNTER — Encounter: Payer: Self-pay | Admitting: Gastroenterology

## 2021-08-09 ENCOUNTER — Other Ambulatory Visit: Payer: Self-pay

## 2021-08-09 ENCOUNTER — Ambulatory Visit (AMBULATORY_SURGERY_CENTER): Payer: Self-pay | Admitting: *Deleted

## 2021-08-09 VITALS — Ht 71.0 in | Wt 200.0 lb

## 2021-08-09 DIAGNOSIS — Z1211 Encounter for screening for malignant neoplasm of colon: Secondary | ICD-10-CM

## 2021-08-09 NOTE — Progress Notes (Signed)

## 2021-08-16 ENCOUNTER — Encounter: Payer: Self-pay | Admitting: Gastroenterology

## 2021-08-23 ENCOUNTER — Other Ambulatory Visit: Payer: Self-pay

## 2021-08-23 ENCOUNTER — Ambulatory Visit (AMBULATORY_SURGERY_CENTER): Payer: Medicare HMO | Admitting: Gastroenterology

## 2021-08-23 ENCOUNTER — Encounter: Payer: Self-pay | Admitting: Gastroenterology

## 2021-08-23 VITALS — BP 104/70 | HR 56 | Temp 98.9°F | Resp 28 | Ht 70.0 in | Wt 200.0 lb

## 2021-08-23 DIAGNOSIS — K633 Ulcer of intestine: Secondary | ICD-10-CM | POA: Diagnosis not present

## 2021-08-23 DIAGNOSIS — Z1211 Encounter for screening for malignant neoplasm of colon: Secondary | ICD-10-CM

## 2021-08-23 DIAGNOSIS — D123 Benign neoplasm of transverse colon: Secondary | ICD-10-CM

## 2021-08-23 DIAGNOSIS — D124 Benign neoplasm of descending colon: Secondary | ICD-10-CM | POA: Diagnosis not present

## 2021-08-23 DIAGNOSIS — K51914 Ulcerative colitis, unspecified with abscess: Secondary | ICD-10-CM | POA: Diagnosis not present

## 2021-08-23 MED ORDER — SODIUM CHLORIDE 0.9 % IV SOLN: 500.0000 mL | Freq: Once | INTRAVENOUS | Status: DC

## 2021-08-23 NOTE — Patient Instructions (Addendum)
Handout provided on polyps and hemorrhoids.  ? ?Recommend a high-fiber diet (see handout). ?Use FiberCon 1-2 tablets by mouth daily.  ? ?YOU HAD AN ENDOSCOPIC PROCEDURE TODAY AT [facility]:   Refer to the procedure report that was given to you for any specific questions about what was found during the examination.  If the procedure report does not answer your questions, please call your gastroenterologist to clarify.  If you requested that your care partner not be given the details of your procedure findings, then the procedure report has been included in a sealed envelope for you to review at your convenience later. ? ?YOU SHOULD EXPECT: Some feelings of bloating in the abdomen. Passage of more gas than usual.  Walking can help get rid of the air that was put into your GI tract during the procedure and reduce the bloating. If you had a lower endoscopy (such as a colonoscopy or flexible sigmoidoscopy) you may notice spotting of blood in your stool or on the toilet paper. If you underwent a bowel prep for your procedure, you may not have a normal bowel movement for a few days. ? ?Please Note:  You might notice some irritation and congestion in your nose or some drainage.  This is from the oxygen used during your procedure.  There is no need for concern and it should clear up in a day or so. ? ?SYMPTOMS TO REPORT IMMEDIATELY: ? ?Following lower endoscopy (colonoscopy or flexible sigmoidoscopy): ? Excessive amounts of blood in the stool ? Significant tenderness or worsening of abdominal pains ? Swelling of the abdomen that is new, acute ? Fever of 100?F or higher ? ?For urgent or emergent issues, a gastroenterologist can be reached at any hour by calling [phone]. ?Do not use MyChart messaging for urgent concerns.  ? ? ?DIET:  We do recommend a small meal at first, but then you may proceed to your regular diet.  Drink plenty of fluids but you should avoid alcoholic beverages for 24  hours. ? ?ACTIVITY:  You should plan to take it easy for the rest of today and you should NOT DRIVE or use heavy machinery until tomorrow (because of the sedation medicines used during the test).   ? ?FOLLOW UP: ?Our staff will call the number listed on your records 48-72 hours following your procedure to check on you and address any questions or concerns that you may have regarding the information given to you following your procedure. If we do not reach you, we will leave a message.  We will attempt to reach you two times.  During this call, we will ask if you have developed any symptoms of COVID 19. If you develop any symptoms (ie: fever, flu-like symptoms, shortness of breath, cough etc.) before then, please call [phone].  If you test positive for Covid 19 in the 2 weeks post procedure, please call and report this information to [place].   ? ?If any biopsies were taken you will be contacted by phone or by letter within the next 1-3 weeks.  Please call us at [phone] if you have not heard about the biopsies in 3 weeks.  ? ? ?SIGNATURES/CONFIDENTIALITY: ?You and/or your care partner have signed paperwork which will be entered into your electronic medical record.  These signatures attest to the fact that that the information above on your After Visit Summary has been reviewed and is understood.  Full responsibility of the confidentiality of this discharge information lies with you  and/or your care-partner. ? ?

## 2021-08-23 NOTE — Op Note (Signed)
[place] ?Patient Name: Reginald Adkins ?Procedure Date: 08/23/2021 8:39 AM ?MRN: [id number] ?Endoscopist: Justice Britain , MD ?Age: 71 ?Referring MD:  ?Date of Birth: 18-Jun-1950 ?Gender: Male ?Account #: [id number] ?Procedure:                Colonoscopy ?Indications:              Positive Cologuard test, This is the patient's  ?                          first colonoscopy ?Medicines:                Monitored Anesthesia Care ?Procedure:                Pre-Anesthesia Assessment: ?                          - Prior to the procedure, a History and Physical  ?                          was performed, and patient medications and  ?                          allergies were reviewed. The patient's tolerance of  ?                          previous anesthesia was also reviewed. The risks  ?                          and benefits of the procedure and the sedation  ?                          options and risks were discussed with the patient.  ?                          All questions were answered, and informed consent  ?                          was obtained. Prior Anticoagulants: The patient has  ?                          taken no previous anticoagulant or antiplatelet  ?                          agents except for aspirin. ASA Grade Assessment: II  ?                          - A patient with mild systemic disease. After  ?                          reviewing the risks and benefits, the patient was  ?                          deemed in satisfactory condition to undergo the  ?  procedure. ?                          After obtaining informed consent, the colonoscope  ?                          was passed under direct vision. Throughout the  ?                          procedure, the patient's blood pressure, pulse, and  ?                          oxygen saturations were monitored continuously. The  ?                          CF [id number] #[id number] was introduced through the anus  ?                           and advanced to the the cecum, identified by  ?                          appendiceal orifice and ileocecal valve. The  ?                          colonoscopy was performed without difficulty. The  ?                          patient tolerated the procedure. The quality of the  ?                          bowel preparation was good. The terminal ileum,  ?                          ileocecal valve, appendiceal orifice, and rectum  ?                          were photographed. ?Scope In: 8:48:06 AM ?Scope Out: 9:08:01 AM ?Scope Withdrawal Time: 0 hours 16 minutes 50 seconds  ?Total Procedure Duration: 0 hours 19 minutes 55 seconds  ?Findings:                 Skin tags were found on perianal exam. There was  ?                          also what appeared to be a mole vs darkened skin  ?                          tag about 1 inch from the anal os. ?                          The digital rectal exam findings include  ?                          hemorrhoids. Pertinent negatives include no  ?  palpable rectal lesions. ?                          The terminal ileum and ileocecal valve appeared  ?                          normal. ?                          Two sessile polyps were found in the descending  ?                          colon and transverse colon. The polyps were 3 to 16  ?                          mm in size (largest was in TC). These polyps were  ?                          removed with a cold snare. Resection and retrieval  ?                          were complete. ?                          Localized severe mucosal changes characterized by  ?                          congestion (edema), erosions, friability and  ?                          shallow ulcerations were found in the cecum.  ?                          Biopsies were taken with a cold forceps for  ?                          histology to rule out chronic inflammation. ?                          Normal mucosa was found in the entire  colon  ?                          otherwise. Biopsies were taken with a cold forceps  ?                          for histology from the right colon to rule out  ?                          chronic colitis. Biopsies were taken with a cold  ?                          forceps for histology from the left colon to rule  ?  out chronic colitis. ?                          Non-bleeding non-thrombosed internal hemorrhoids  ?                          were found during retroflexion, during perianal  ?                          exam and during digital exam. The hemorrhoids were  ?                          Grade II (internal hemorrhoids that prolapse but  ?                          reduce spontaneously). ?Complications:            No immediate complications. ?Estimated Blood Loss:     Estimated blood loss was minimal. ?Impression:               - Hemorrhoids found on digital rectal exam. Skin  ?                          tags noted. Atypical skin tag vs mole in perineal  ?                          region noted as well. ?                          - The examined portion of the ileum was normal. ?                          - Two 3 to 16 mm polyps in the descending colon and  ?                          in the transverse colon, removed with a cold snare.  ?                          Resected and retrieved. ?                          - Localized severe mucosal changes were found in  ?                          the cecum secondary to likely colitis. Biopsied. ?                          - Normal mucosa in the entire examined colon  ?                          otherwise. Biopsied. ?                          - Non-bleeding non-thrombosed internal hemorrhoids. ?Recommendation:           - The patient will be observed post-procedure,  ?  until all discharge criteria are met. ?                          - Discharge patient to home. ?                          - Patient has a contact number available for   ?                          emergencies. The signs and symptoms of potential  ?                          delayed complications were discussed with the  ?                          patient. Return to normal activities tomorrow.  ?                          Written discharge instructions were provided to the  ?                          patient. ?                          - High fiber diet. ?                          - Use FiberCon 1-2 tablets PO daily. ?                          - Continue present medications. ?                          - Await pathology results. ?                          - Repeat colonoscopy in 1 year for surveillance due  ?                          to piecemeal resection and for follow up of the  ?                          significant changes noted in the cecum. ?                          - Followup in clinic to discuss any other symptoms  ?                          and follow up based on final pathology findings.  ?                          NSAID use could cause these changes at times, but  ?                          will maintain ASA for now. ?                          -  Referral to Dermatology is recommended to ensure  ?                          the skin tag is only such vs if this is an atypical  ?                          mole in the perianal region. ?                          - The findings and recommendations were discussed  ?                          with the patient. ?                          - The findings and recommendations were discussed  ?                          with the designated responsible adult. ?Justice Britain, MD ?08/23/2021 9:17:09 AM ?

## 2021-08-23 NOTE — Progress Notes (Signed)
PT taken to PACU. Monitors in place. VSS. Report given to RN. 

## 2021-08-23 NOTE — Progress Notes (Signed)
? ?GASTROENTEROLOGY PROCEDURE H&P NOTE  ? ?Primary Care Physician: ?Elby Showers, MD ? ?HPI: ?Reginald Adkins is a 71 y.o. male who presents for Colonoscopy for Positive Cologuard. ? ?Past Medical History:  ?Diagnosis Date  ? Anxiety   ? Depression   ? Diabetes mellitus without complication ([place])   ? Type II w oral agents  ? GERD (gastroesophageal reflux disease)   ? Headache   ? hx of migraines but have kind of subsided some  ? Hypertension   ? Stone, kidney   ? Renal stones;  left  Mid ureteral stone  ? ?Past Surgical History:  ?Procedure Laterality Date  ? EXTRACORPOREAL SHOCK WAVE LITHOTRIPSY  early 2000s  ? INNER EAR SURGERY Bilateral 1964  ? Multiple ear surgeries  ? STONE EXTRACTION WITH BASKET   early 2000's  ? TONSILLECTOMY    ?  as child  ? ?Current Outpatient Medications  ?Medication Sig Dispense Refill  ? ALPRAZolam (XANAX) 0.25 MG tablet Take 1 tablet (0.25 mg total) by mouth 3 (three) times daily as needed for anxiety. 90 tablet 5  ? amLODipine (NORVASC) 5 MG tablet Take 1 tablet by mouth once daily 90 tablet 3  ? aspirin 81 MG chewable tablet Chew 81 mg by mouth daily.    ? atorvastatin (LIPITOR) 10 MG tablet TAKE 1 TABLET BY MOUTH EVERY DAY 90 tablet 3  ? glipiZIDE (GLUCOTROL XL) 5 MG 24 hr tablet TAKE 1 TABLET(5 MG) BY MOUTH DAILY WITH BREAKFAST 90 tablet 3  ? hydrochlorothiazide (HYDRODIURIL) 25 MG tablet TAKE 1 TABLET BY MOUTH DAILY 90 tablet 1  ? losartan (COZAAR) 100 MG tablet TAKE 1 TABLET BY MOUTH EVERY DAY 90 tablet 1  ? metFORMIN (GLUCOPHAGE) 500 MG tablet TAKE 1 TABLET BY MOUTH TWICE DAILY WITH MEALS 180 tablet prn  ? metoprolol tartrate (LOPRESSOR) 25 MG tablet TAKE 1 TABLET BY MOUTH TWICE DAILY 180 tablet 3  ? sertraline (ZOLOFT) 50 MG tablet Take 1 tablet (50 mg total) by mouth daily. 90 tablet 3  ? ?No current facility-administered medications for this visit.  ? ? ?Current Outpatient Medications:  ?  ALPRAZolam (XANAX) 0.25 MG tablet, Take 1 tablet (0.25 mg total) by mouth 3 (three)  times daily as needed for anxiety., Disp: 90 tablet, Rfl: 5 ?  amLODipine (NORVASC) 5 MG tablet, Take 1 tablet by mouth once daily, Disp: 90 tablet, Rfl: 3 ?  aspirin 81 MG chewable tablet, Chew 81 mg by mouth daily., Disp: , Rfl:  ?  atorvastatin (LIPITOR) 10 MG tablet, TAKE 1 TABLET BY MOUTH EVERY DAY, Disp: 90 tablet, Rfl: 3 ?  glipiZIDE (GLUCOTROL XL) 5 MG 24 hr tablet, TAKE 1 TABLET(5 MG) BY MOUTH DAILY WITH BREAKFAST, Disp: 90 tablet, Rfl: 3 ?  hydrochlorothiazide (HYDRODIURIL) 25 MG tablet, TAKE 1 TABLET BY MOUTH DAILY, Disp: 90 tablet, Rfl: 1 ?  losartan (COZAAR) 100 MG tablet, TAKE 1 TABLET BY MOUTH EVERY DAY, Disp: 90 tablet, Rfl: 1 ?  metFORMIN (GLUCOPHAGE) 500 MG tablet, TAKE 1 TABLET BY MOUTH TWICE DAILY WITH MEALS, Disp: 180 tablet, Rfl: prn ?  metoprolol tartrate (LOPRESSOR) 25 MG tablet, TAKE 1 TABLET BY MOUTH TWICE DAILY, Disp: 180 tablet, Rfl: 3 ?  sertraline (ZOLOFT) 50 MG tablet, Take 1 tablet (50 mg total) by mouth daily., Disp: 90 tablet, Rfl: 3 ?Allergies  ?Allergen Reactions  ? Morphine And Related   ?  hallucinations  ? Amoxicillin Itching  ? ?Family History  ?Problem Relation Age of Onset  ?  Cancer Brother   ? Colon cancer Neg Hx   ? Colon polyps Neg Hx   ? Esophageal cancer Neg Hx   ? Rectal cancer Neg Hx   ? Stomach cancer Neg Hx   ? ?Social History  ? ?Socioeconomic History  ? Marital status: Married  ?  Spouse name: Not on file  ? Number of children: Not on file  ? Years of education: Not on file  ? Highest education level: Not on file  ?Occupational History  ? Not on file  ?Tobacco Use  ? Smoking status: Never  ? Smokeless tobacco: Never  ?Vaping Use  ? Vaping Use: Never used  ?Substance and Sexual Activity  ? Alcohol use: No  ? Drug use: No  ? Sexual activity: Not on file  ?Other Topics Concern  ? Not on file  ?Social History Narrative  ? Not on file  ? ?Social Determinants of Health  ? ?Financial Resource Strain: Not on file  ?Food Insecurity: Not on file  ?Transportation Needs: Not  on file  ?Physical Activity: Not on file  ?Stress: Not on file  ?Social Connections: Not on file  ?Intimate Partner Violence: Not on file  ? ? ?Physical Exam: ?There were no vitals filed for this visit. ?There is no height or weight on file to calculate BMI. ?GEN: NAD ?EYE: Sclerae anicteric ?ENT: MMM ?CV: Non-tachycardic ?GI: Soft, NT/ND ?NEURO:  Alert & Oriented x 3 ? ?Lab Results: ?No results for input(s): WBC, HGB, HCT, PLT in the last 72 hours. ?BMET ?No results for input(s): NA, K, CL, CO2, GLUCOSE, BUN, CREATININE, CALCIUM in the last 72 hours. ?LFT ?No results for input(s): PROT, ALBUMIN, AST, ALT, ALKPHOS, BILITOT, BILIDIR, IBILI in the last 72 hours. ?PT/INR ?No results for input(s): LABPROT, INR in the last 72 hours. ? ? ?Impression / Plan: ?This is a 71 y.o.male who presents for Colonoscopy for positive Cologuard. ? ?The risks and benefits of endoscopic evaluation/treatment were discussed with the patient and/or family; these include but are not limited to the risk of perforation, infection, bleeding, missed lesions, lack of diagnosis, severe illness requiring hospitalization, as well as anesthesia and sedation related illnesses.  The patient's history has been reviewed, patient examined, no change in status, and deemed stable for procedure.  The patient and/or family is agreeable to proceed.  ? ? ?Justice Britain, MD ?[facility] ?Advanced Endoscopy ?Office # [phone]. ? ?

## 2021-08-25 ENCOUNTER — Telehealth: Payer: Self-pay

## 2021-08-25 ENCOUNTER — Telehealth: Payer: Self-pay | Admitting: *Deleted

## 2021-08-25 NOTE — Telephone Encounter (Signed)
Left message on follow up call. 

## 2021-08-25 NOTE — Telephone Encounter (Signed)
Patient returned call. States he is doing great. Reports he have had minium bowel movements and bloating. But have went back to work and is doing fine.  ?

## 2021-08-25 NOTE — Telephone Encounter (Signed)
?  Follow up Call-voicemail left. ?

## 2021-08-30 ENCOUNTER — Encounter: Payer: Self-pay | Admitting: Gastroenterology

## 2021-09-09 ENCOUNTER — Other Ambulatory Visit: Payer: Self-pay | Admitting: Internal Medicine

## 2021-10-27 ENCOUNTER — Other Ambulatory Visit: Payer: Self-pay | Admitting: Internal Medicine

## 2021-12-31 ENCOUNTER — Other Ambulatory Visit: Payer: Self-pay | Admitting: Internal Medicine

## 2022-01-02 ENCOUNTER — Other Ambulatory Visit: Payer: Self-pay

## 2022-01-02 MED ORDER — METFORMIN HCL 500 MG PO TABS: 500.0000 mg | ORAL_TABLET | Freq: Two times a day (BID) | ORAL | 0 refills | Status: DC

## 2022-02-26 ENCOUNTER — Other Ambulatory Visit: Payer: Self-pay | Admitting: Internal Medicine

## 2022-03-02 ENCOUNTER — Other Ambulatory Visit: Payer: Medicare HMO

## 2022-03-02 DIAGNOSIS — E785 Hyperlipidemia, unspecified: Secondary | ICD-10-CM | POA: Diagnosis not present

## 2022-03-02 DIAGNOSIS — E119 Type 2 diabetes mellitus without complications: Secondary | ICD-10-CM | POA: Diagnosis not present

## 2022-03-02 DIAGNOSIS — E1169 Type 2 diabetes mellitus with other specified complication: Secondary | ICD-10-CM

## 2022-03-02 DIAGNOSIS — F411 Generalized anxiety disorder: Secondary | ICD-10-CM

## 2022-03-02 DIAGNOSIS — Z8659 Personal history of other mental and behavioral disorders: Secondary | ICD-10-CM

## 2022-03-02 DIAGNOSIS — I10 Essential (primary) hypertension: Secondary | ICD-10-CM | POA: Diagnosis not present

## 2022-03-02 DIAGNOSIS — Z125 Encounter for screening for malignant neoplasm of prostate: Secondary | ICD-10-CM

## 2022-03-03 LAB — LIPID PANEL
Cholesterol: 120 mg/dL (ref <200)
HDL: 38 mg/dL — ABNORMAL LOW (ref >=40)
LDL Cholesterol (Calc): 67 mg/dL (calc)
Non-HDL Cholesterol (Calc): 82 mg/dL (calc) (ref <130)
Total CHOL/HDL Ratio: 3.2 (calc) (ref <5.0)
Triglycerides: 70 mg/dL (ref <150)

## 2022-03-03 LAB — CBC WITH DIFFERENTIAL/PLATELET
Absolute Monocytes: 492 cells/uL (ref 200–950)
Basophils Absolute: 42 cells/uL (ref 0–200)
Basophils Relative: 0.7 %
Eosinophils Absolute: 300 cells/uL (ref 15–500)
Eosinophils Relative: 5 %
HCT: 43.7 % (ref 38.5–50.0)
Hemoglobin: 14.9 g/dL (ref 13.2–17.1)
Lymphs Abs: 1428 cells/uL (ref 850–3900)
MCH: 32 pg (ref 27.0–33.0)
MCHC: 34.1 g/dL (ref 32.0–36.0)
MCV: 93.8 fL (ref 80.0–100.0)
MPV: 10.5 fL (ref 7.5–12.5)
Monocytes Relative: 8.2 %
Neutro Abs: 3738 cells/uL (ref 1500–7800)
Neutrophils Relative %: 62.3 %
Platelets: 199 10*3/uL (ref 140–400)
RBC: 4.66 10*6/uL (ref 4.20–5.80)
RDW: 12.5 % (ref 11.0–15.0)
Total Lymphocyte: 23.8 %
WBC: 6 10*3/uL (ref 3.8–10.8)

## 2022-03-03 LAB — HEMOGLOBIN A1C
Hgb A1c MFr Bld: 6 % of total Hgb — ABNORMAL HIGH (ref <5.7)
Mean Plasma Glucose: 126 mg/dL
eAG (mmol/L): 7 mmol/L

## 2022-03-03 LAB — COMPLETE METABOLIC PANEL WITH GFR
AG Ratio: 1.7 (calc) (ref 1.0–2.5)
ALT: 14 U/L (ref 9–46)
AST: 17 U/L (ref 10–35)
Albumin: 4.5 g/dL (ref 3.6–5.1)
Alkaline phosphatase (APISO): 56 U/L (ref 35–144)
BUN: 23 mg/dL (ref 7–25)
CO2: 31 mmol/L (ref 20–32)
Calcium: 9.7 mg/dL (ref 8.6–10.3)
Chloride: 102 mmol/L (ref 98–110)
Creat: 1.19 mg/dL (ref 0.70–1.28)
Globulin: 2.6 g/dL (calc) (ref 1.9–3.7)
Glucose, Bld: 127 mg/dL — ABNORMAL HIGH (ref 65–99)
Potassium: 4.2 mmol/L (ref 3.5–5.3)
Sodium: 142 mmol/L (ref 135–146)
Total Bilirubin: 0.8 mg/dL (ref 0.2–1.2)
Total Protein: 7.1 g/dL (ref 6.1–8.1)
eGFR: 66 mL/min/{1.73_m2} (ref >=60)

## 2022-03-03 LAB — MICROALBUMIN, URINE: Microalb, Ur: 2.6 mg/dL

## 2022-03-03 LAB — PSA: PSA: 0.18 ng/mL (ref <=4.00)

## 2022-03-07 ENCOUNTER — Encounter: Payer: Self-pay | Admitting: Internal Medicine

## 2022-03-07 ENCOUNTER — Ambulatory Visit (INDEPENDENT_AMBULATORY_CARE_PROVIDER_SITE_OTHER): Payer: Medicare HMO | Admitting: Internal Medicine

## 2022-03-07 VITALS — BP 136/84 | HR 63 | Temp 98.3°F | Ht 70.0 in | Wt 193.1 lb

## 2022-03-07 DIAGNOSIS — F439 Reaction to severe stress, unspecified: Secondary | ICD-10-CM

## 2022-03-07 DIAGNOSIS — Z0000 Encounter for general adult medical examination without abnormal findings: Secondary | ICD-10-CM | POA: Diagnosis not present

## 2022-03-07 DIAGNOSIS — E1169 Type 2 diabetes mellitus with other specified complication: Secondary | ICD-10-CM | POA: Diagnosis not present

## 2022-03-07 DIAGNOSIS — F411 Generalized anxiety disorder: Secondary | ICD-10-CM

## 2022-03-07 DIAGNOSIS — E119 Type 2 diabetes mellitus without complications: Secondary | ICD-10-CM

## 2022-03-07 DIAGNOSIS — Z87442 Personal history of urinary calculi: Secondary | ICD-10-CM | POA: Diagnosis not present

## 2022-03-07 DIAGNOSIS — F325 Major depressive disorder, single episode, in full remission: Secondary | ICD-10-CM

## 2022-03-07 DIAGNOSIS — Z8659 Personal history of other mental and behavioral disorders: Secondary | ICD-10-CM

## 2022-03-07 DIAGNOSIS — E785 Hyperlipidemia, unspecified: Secondary | ICD-10-CM

## 2022-03-07 DIAGNOSIS — I10 Essential (primary) hypertension: Secondary | ICD-10-CM

## 2022-03-07 DIAGNOSIS — H903 Sensorineural hearing loss, bilateral: Secondary | ICD-10-CM

## 2022-03-07 LAB — POCT URINALYSIS DIPSTICK
Bilirubin, UA: NEGATIVE
Blood, UA: NEGATIVE
Glucose, UA: NEGATIVE
Ketones, UA: NEGATIVE
Leukocytes, UA: NEGATIVE
Nitrite, UA: NEGATIVE
Protein, UA: NEGATIVE
Spec Grav, UA: 1.015 (ref 1.010–1.025)
Urobilinogen, UA: 0.2 E.U./dL
pH, UA: 5 (ref 5.0–8.0)

## 2022-03-07 NOTE — Patient Instructions (Addendum)
Vaccines discussed. Will need dates from pharmacy when received.  He will let us know.  He will continue with current medications and follow-up in 6 months.

## 2022-03-07 NOTE — Progress Notes (Signed)
Annual Wellness Visit     Patient: Reginald Adkins, Male    DOB: [birth year], 71 y.o.   MRN: [id number] Visit Date: 03/07/2022  Chief Complaint  Patient presents with   Medicare Wellness   Subjective    Reginald Adkins is a 71 y.o. Male who presents today for his Annual Wellness Visit.  HPI He is also seen for seen for health maintenance exam and evaluation of medical issues.  He has a history of type 2 diabetes mellitus without complication, anxiety, depression, hypertension, hyperlipidemia.  He plans to get COVID booster.  Social history: He does not smoke or consume alcohol.  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  Continues to take care of his animals.  Says he needs some dental work done in the near future.  Family history: 1 brother died at age 35 with diabetes.  Death was either due to a stroke or MI.  Another brother died with kidney cancer.  Another brother with Parkinson's disease.  History of left ureteral stone January 2017.  History of dog bite right second and third fingers 2018.  Dog was reportedly up-to-date on rabies vaccine at the time.  He had tonsillectomy as a child.  Multiple ear surgeries as a child.  He has a history of essential hypertension, office hypertension, anxiety, depression, type 2 diabetes mellitus and he worries a lot.  Dr. Gaynelle Arabian asked me to see him in 2013.  He was having significant symptoms of hyperglycemia with nocturia, weakness and polyuria.  His diabetes is now well controlled.  He has a history of low HDL.  Lipid panel is otherwise normal.  Fasting glucose is 127.  Hemoglobin A1c is 6%.   He had colonoscopy in March 2023 as his Cologuard test was positive.  He has a history of hemorrhoids.  His colonoscopy was unremarkable except for hemorrhoids.     Review of Systems he has had some situational stress with family.  He needs some dental work done.         Vitals: Blood pressure 136/84 pulse 63 temperature  98.3 degrees pulse oximetry 98% weight 193 pounds 1.9 ounces height 5 feet 10 inches BMI 27.71  Physical Exam  Skin: Warm and dry.  No skin lesions.  Nodes none.  TMs are clear.  Neck is supple without JVD thyromegaly or carotid bruits.  Chest clear.  Cardiac exam: Regular rate and rhythm without ectopy or murmurs.  Abdomen soft nondistended without hepatosplenomegaly masses or tenderness.  Prostate exam is normal without nodules.  No lower extremity pitting edema.  Affect thought and judgment appear to be normal.  No gross focal deficits on brief neurological exam.    Most recent fall risk assessment:    06/27/2021    9:49 AM  Fall Risk   Falls in the past year? 0  Number falls in past yr: 0  Injury with Fall? 0  Risk for fall due to : No Fall Risks    Most recent depression screenings:    06/27/2021    9:49 AM 12/23/2020   11:14 AM  PHQ 2/9 Scores  PHQ - 2 Score 0 1  PHQ- 9 Score 1 1   Most recent cognitive screening:     No data to display             Assessment & Plan  2 diabetes mellitus-stable on current regimen he is on glipizide and metformin.  He is able to afford these medications  Hyperlipidemia treated with low-dose Lipitor 10 mg daily and stable  Hypertension treated with amlodipine, HCTZ and losartan as well as Lopressor and stable.  Anxiety state and situational stress treated with Xanax up to 3 times daily as needed.  He also takes Zoloft 50 mg daily for anxiety and depression.  He has a history of kidney stones  He has a history of mild hearing loss  Plan: He may return in 6 months or as needed.        Annual wellness visit done today including the all of the following: Reviewed patient's Family Medical History Reviewed and updated list of patient's medical providers Assessment of cognitive impairment was done Assessed patient's functional ability Established a written schedule for health screening [place] Completed  and Reviewed  Discussed health benefits of physical activity, and encouraged him to engage in regular exercise appropriate for his age and condition.         Reginald Showers, MD, have reviewed all documentation for this visit. The documentation on 03/11/22 for the exam, diagnosis, procedures, and orders are all accurate and complete.   Reginald Adkins, CMA

## 2022-04-05 ENCOUNTER — Other Ambulatory Visit: Payer: Self-pay

## 2022-04-05 MED ORDER — METFORMIN HCL 500 MG PO TABS: 500.0000 mg | ORAL_TABLET | Freq: Two times a day (BID) | ORAL | 0 refills | Status: DC

## 2022-05-16 ENCOUNTER — Other Ambulatory Visit: Payer: Self-pay

## 2022-05-16 MED ORDER — GLIPIZIDE ER 5 MG PO TB24: ORAL_TABLET | ORAL | 3 refills | Status: DC

## 2022-07-03 ENCOUNTER — Other Ambulatory Visit: Payer: Self-pay | Admitting: Internal Medicine

## 2022-07-04 ENCOUNTER — Other Ambulatory Visit: Payer: Self-pay | Admitting: Internal Medicine

## 2022-08-29 NOTE — Progress Notes (Signed)
Patient Care Team: Elby Showers, MD as PCP - General (Internal Medicine)  Visit Date: 09/05/22  Subjective:    Patient ID: Reginald Adkins , Male   DOB: [birth year], 72 y.o.    MRN: [id number]   71 y.o. Male presents today for a 6 month follow-up. Patient has a past medical history of anxiety and depression, diabetes mellitus, GERD, headaches, hypertension, kidney stone.  History of Type 2 diabetes mellitus treated with Glucophage 500 mg twice daily with a meal, Glucotrol XL 5 mg daily with breakfast. HGBA1c at 6.5% on 08/31/22.  History of hyperlipidemia treated with Lipitor 10 mg daily. HDL low at 38 on 08/31/22. Lipid panel otherwise normal.  History of hypertension treated with Norvasc 5 mg daily, hydrochlorothiazide 25 mg daily, Cozaar 100 mg daily. Blood pressure normal today at 136/82.  History of anxiety and depression treated with Xanax 0.25 mg three times daily as needed, Zoloft 50 mg daily. Mood stable.  Colonoscopy last completed 08/23/21 by Dr. Gertie Fey. Results showed active colitis, large bowel mucosa, 2 serrated adenomas. Repeat recommended in 2025.   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 No new complaints.     Review of Systems  Constitutional:  Negative for fever and malaise/fatigue.  HENT:  Negative for congestion.   Eyes:  Negative for blurred vision.  Respiratory:  Negative for cough and shortness of breath.    Cardiovascular:  Negative for chest pain, palpitations and leg swelling.  Gastrointestinal:  Negative for vomiting.  Musculoskeletal:  Negative for back pain.  Skin:  Negative for rash.  Neurological:  Negative for loss of consciousness and headaches.        Objective:   Vitals: BP 130/70   Pulse 61   Temp 97.9 F (36.6 C) (Tympanic)   Ht 5\' 10"  (1.778 m)   Wt 199 lb 6.4 oz (90.4 kg)   SpO2 98%   BMI 28.61 kg/m    Physical Exam Constitutional:      General: He is not in acute distress.    Appearance: Normal appearance. He is not ill-appearing.  HENT:     Head: Normocephalic and atraumatic.  Cardiovascular:     Rate and Rhythm: Normal rate and regular rhythm.     Pulses:          Dorsalis pedis pulses are 1+ on the right side and 1+ on the left side.     Heart sounds: Normal heart sounds. No murmur heard.    No friction rub. No gallop.  Pulmonary:     Effort: Pulmonary effort is normal. No respiratory distress.     Breath sounds: Normal breath sounds. No wheezing or rales.  Musculoskeletal:     Right lower leg: No edema.  Left lower leg: No edema.  Skin:    General: Skin is warm and dry.     Comments: Seborrheic keratosis on left inner arm.  Neurological:     Mental Status: He is alert and oriented to person, place, and time. Mental status is at baseline.  Psychiatric:        Mood and Affect: Mood normal.        Behavior: Behavior normal.        Thought Content: Thought content normal.        Judgment: Judgment normal.       Results:   Studies obtained and personally reviewed by me:  Colonoscopy last completed 08/23/21 by Dr. Gertie Fey. Results showed active colitis, large bowel mucosa, 2 serrated adenomas. Repeat recommended in 2025.   Labs:       Component Value Date/Time   NA 142 03/02/2022 0933   K 4.2 03/02/2022 0933   CL 102 03/02/2022 0933   CO2 31 03/02/2022 0933   GLUCOSE 127 (H) 03/02/2022 0933   BUN 23 03/02/2022 0933   CREATININE  1.19 03/02/2022 0933   CALCIUM 9.7 03/02/2022 0933   PROT 7.1 08/31/2022 0917   ALBUMIN 4.5 07/07/2016 1230   AST 16 08/31/2022 0917   ALT 17 08/31/2022 0917   ALKPHOS 60 07/07/2016 1230   BILITOT 1.2 08/31/2022 0917   GFRNONAA 67 12/18/2019 0925   GFRAA 78 12/18/2019 0925     Lab Results  Component Value Date   WBC 6.0 03/02/2022   HGB 14.9 03/02/2022   HCT 43.7 03/02/2022   MCV 93.8 03/02/2022   PLT 199 03/02/2022    Lab Results  Component Value Date   CHOL 115 08/31/2022   HDL 38 (L) 08/31/2022   LDLCALC 63 08/31/2022   TRIG 56 08/31/2022   CHOLHDL 3.0 08/31/2022    Lab Results  Component Value Date   HGBA1C 6.5 (H) 08/31/2022     No results found for: "TSH"   Lab Results  Component Value Date   PSA 0.18 03/02/2022   PSA 0.17 12/20/2020   PSA 0.2 12/18/2019      Assessment & Plan:   Type 2 diabetes mellitus: treated with Glucophage 500 mg twice daily with a meal, Glucotrol XL 5 mg daily with breakfast. HGBA1c at 6.5% on 08/31/22.  Hyperlipidemia: treated with Lipitor 10 mg daily. HDL low at 38 on 08/31/22. Lipid panel otherwise normal.  Hypertension: treated with Norvasc 5 mg daily, hydrochlorothiazide 25 mg daily, Cozaar 100 mg daily. Blood pressure normal today at 136/82.  Anxiety and depression: treated with Xanax 0.25 mg three times daily as needed, Zoloft 50 mg daily. Mood stable.  Colonoscopy: last completed 08/23/21 by Dr. Gertie Fey. Results showed active colitis, large bowel mucosa, 2 serrated adenomas. Repeat recommended in 2025.  Vaccine counseling: will go to pharmacy for tetanus vaccine.  RTC in 6 months for medicare wellness and CPE  I,Alexander Ruley,acting as a scribe for Elby Showers, MD.,have documented all relevant documentation on the behalf of Elby Showers, MD,as directed by  Elby Showers, MD while in the presence of Elby Showers, MD.   I, Elby Showers, MD, have reviewed all documentation for this visit. The documentation on  09/05/22 for the exam, diagnosis, procedures, and orders are all accurate and complete.

## 2022-08-31 ENCOUNTER — Other Ambulatory Visit: Payer: Medicare HMO

## 2022-08-31 DIAGNOSIS — E1169 Type 2 diabetes mellitus with other specified complication: Secondary | ICD-10-CM

## 2022-08-31 DIAGNOSIS — E785 Hyperlipidemia, unspecified: Secondary | ICD-10-CM | POA: Diagnosis not present

## 2022-09-01 LAB — HEPATIC FUNCTION PANEL
AG Ratio: 1.8 (calc) (ref 1.0–2.5)
ALT: 17 U/L (ref 9–46)
AST: 16 U/L (ref 10–35)
Albumin: 4.6 g/dL (ref 3.6–5.1)
Alkaline phosphatase (APISO): 47 U/L (ref 35–144)
Bilirubin, Direct: 0.3 mg/dL — ABNORMAL HIGH (ref 0.0–0.2)
Globulin: 2.5 g/dL (calc) (ref 1.9–3.7)
Indirect Bilirubin: 0.9 mg/dL (calc) (ref 0.2–1.2)
Total Bilirubin: 1.2 mg/dL (ref 0.2–1.2)
Total Protein: 7.1 g/dL (ref 6.1–8.1)

## 2022-09-01 LAB — LIPID PANEL
Cholesterol: 115 mg/dL (ref <200)
HDL: 38 mg/dL — ABNORMAL LOW (ref >=40)
LDL Cholesterol (Calc): 63 mg/dL (calc)
Non-HDL Cholesterol (Calc): 77 mg/dL (calc) (ref <130)
Total CHOL/HDL Ratio: 3 (calc) (ref <5.0)
Triglycerides: 56 mg/dL (ref <150)

## 2022-09-01 LAB — HEMOGLOBIN A1C
Hgb A1c MFr Bld: 6.5 % of total Hgb — ABNORMAL HIGH (ref <5.7)
Mean Plasma Glucose: 140 mg/dL
eAG (mmol/L): 7.7 mmol/L

## 2022-09-05 ENCOUNTER — Ambulatory Visit (INDEPENDENT_AMBULATORY_CARE_PROVIDER_SITE_OTHER): Payer: Medicare HMO | Admitting: Internal Medicine

## 2022-09-05 ENCOUNTER — Encounter: Payer: Self-pay | Admitting: Internal Medicine

## 2022-09-05 VITALS — BP 130/70 | HR 61 | Temp 97.9°F | Ht 70.0 in | Wt 199.4 lb

## 2022-09-05 DIAGNOSIS — Z8659 Personal history of other mental and behavioral disorders: Secondary | ICD-10-CM

## 2022-09-05 DIAGNOSIS — E119 Type 2 diabetes mellitus without complications: Secondary | ICD-10-CM | POA: Diagnosis not present

## 2022-09-05 DIAGNOSIS — E785 Hyperlipidemia, unspecified: Secondary | ICD-10-CM | POA: Diagnosis not present

## 2022-09-05 DIAGNOSIS — I10 Essential (primary) hypertension: Secondary | ICD-10-CM

## 2022-09-05 DIAGNOSIS — E1169 Type 2 diabetes mellitus with other specified complication: Secondary | ICD-10-CM | POA: Diagnosis not present

## 2022-09-05 NOTE — Patient Instructions (Signed)
It was a pleasure to see you today. Please continue current meds and RTC in 6 months for medicare wellness visit and annual health maintenance exam.

## 2022-09-06 ENCOUNTER — Other Ambulatory Visit: Payer: Self-pay | Admitting: Internal Medicine

## 2022-09-06 LAB — MICROALBUMIN / CREATININE URINE RATIO
Creatinine, Urine: 235 mg/dL (ref 20–320)
Microalb Creat Ratio: 10 mg/g creat (ref <30)
Microalb, Ur: 2.3 mg/dL

## 2022-09-20 ENCOUNTER — Encounter: Payer: Self-pay | Admitting: Gastroenterology

## 2022-09-29 ENCOUNTER — Other Ambulatory Visit: Payer: Self-pay | Admitting: Internal Medicine

## 2022-10-20 ENCOUNTER — Other Ambulatory Visit: Payer: Self-pay | Admitting: Internal Medicine

## 2022-11-03 ENCOUNTER — Ambulatory Visit (AMBULATORY_SURGERY_CENTER): Payer: Medicare HMO

## 2022-11-03 VITALS — Ht 70.0 in | Wt 190.0 lb

## 2022-11-03 DIAGNOSIS — Z86010 Personal history of colonic polyps: Secondary | ICD-10-CM

## 2022-11-03 NOTE — Progress Notes (Signed)
No egg or soy allergy known to patient  No issues known to pt with past sedation with any surgeries or procedures Patient denies ever being told they had issues or difficulty with intubation  No FH of Malignant Hyperthermia Pt is not on diet pills Pt is not on  home 02  Pt is not on blood thinners  Pt denies issues with constipation  No A fib or A flutter Have any cardiac testing pending--no  Pt is ambulatory  Patient's chart reviewed by Cathlyn Parsons CNRA prior to previsit and patient appropriate for the [facility].  Previsit completed and red dot placed by patient's name on their procedure day (on provider's schedule).     PV completed with patient. Prep reviewed. Instructions sent to home address. Pt to purchase prep products OTC.

## 2022-11-17 ENCOUNTER — Encounter: Payer: Self-pay | Admitting: Gastroenterology

## 2022-11-26 ENCOUNTER — Encounter: Payer: Self-pay | Admitting: Certified Registered Nurse Anesthetist

## 2022-11-30 ENCOUNTER — Telehealth: Payer: Self-pay | Admitting: Gastroenterology

## 2022-11-30 NOTE — Telephone Encounter (Signed)
Made a third attempt to call the pt. Still no answer. Left detailed message that as long as loose stools were not associated with a suspected stomach virus then he needs to still take the bowel prep as written in instructions. Instructed that stool needed to be a clear, yellow liquid and not brown. Instructed pt to call back if he had further questions that were not answer in message left. Also will try once more to reach pt before the clinic closes for the day.

## 2022-11-30 NOTE — Telephone Encounter (Signed)
Inbound call from patient stating he has loose stool but has not started his prep medication for procedure in the morning Please advise.   Thank you

## 2022-11-30 NOTE — Telephone Encounter (Signed)
Attempted to return pt's call. No answer. Left message that we would call back shortly.

## 2022-11-30 NOTE — Telephone Encounter (Signed)
RN was able to speak with pt. He reports that he does not feel he has a virus. He believes the loose stools are related to the diet restrictions for the prep process and then being on clear liquids all day today. He has already started his prep and reported that he is tolerating it well. Plan to proceed with colonoscopy tomorrow.

## 2022-12-01 ENCOUNTER — Ambulatory Visit: Payer: Medicare HMO | Admitting: Gastroenterology

## 2022-12-01 ENCOUNTER — Encounter: Payer: Self-pay | Admitting: Gastroenterology

## 2022-12-01 VITALS — BP 103/56 | HR 79 | Temp 99.1°F | Resp 13 | Ht 70.0 in | Wt 190.0 lb

## 2022-12-01 DIAGNOSIS — D124 Benign neoplasm of descending colon: Secondary | ICD-10-CM | POA: Diagnosis not present

## 2022-12-01 DIAGNOSIS — Z09 Encounter for follow-up examination after completed treatment for conditions other than malignant neoplasm: Secondary | ICD-10-CM | POA: Diagnosis not present

## 2022-12-01 DIAGNOSIS — Z86010 Personal history of colonic polyps: Secondary | ICD-10-CM

## 2022-12-01 DIAGNOSIS — I10 Essential (primary) hypertension: Secondary | ICD-10-CM | POA: Diagnosis not present

## 2022-12-01 DIAGNOSIS — Z1211 Encounter for screening for malignant neoplasm of colon: Secondary | ICD-10-CM | POA: Diagnosis not present

## 2022-12-01 DIAGNOSIS — E119 Type 2 diabetes mellitus without complications: Secondary | ICD-10-CM | POA: Diagnosis not present

## 2022-12-01 DIAGNOSIS — D123 Benign neoplasm of transverse colon: Secondary | ICD-10-CM | POA: Diagnosis not present

## 2022-12-01 MED ORDER — SODIUM CHLORIDE 0.9 % IV SOLN: 500.0000 mL | Freq: Once | INTRAVENOUS | Status: DC

## 2022-12-01 NOTE — Patient Instructions (Addendum)
Patient has a contact number available for                            emergencies. The signs and symptoms of potential                            delayed complications were discussed with the                            patient. Return to normal activities tomorrow.                            Written discharge instructions were provided to the                            patient.                           - High fiber diet.                           - Use FiberCon 1-2 tablets PO daily.                           - Continue present medications.                           - Await pathology results.                           - Repeat colonoscopy in 3 years for surveillance                            based on pathology results due to history of                            previous advanced adenoma and overall number of                            polyps removed within a year.                           - The findings and recommendations were discussed                            with the patient.                           - The findings and recommendations were discussed                            with the patient's family.  Handout on polyps and diverticulosis.   YOU HAD AN ENDOSCOPIC PROCEDURE TODAY AT THE [facility]:   Refer to the procedure report that was given to you for any specific questions about what was found during the examination.  If the procedure  report does not answer your questions, please call your gastroenterologist to clarify.  If you requested that your care partner not be given the details of your procedure findings, then the procedure report has been included in a sealed envelope for you to review at your convenience later.  YOU SHOULD EXPECT: Some feelings of bloating in the abdomen. Passage of more gas than usual.  Walking can help get rid of the air that was put into your GI tract during the procedure and reduce the bloating. If you had a lower endoscopy (such as a  colonoscopy or flexible sigmoidoscopy) you may notice spotting of blood in your stool or on the toilet paper. If you underwent a bowel prep for your procedure, you may not have a normal bowel movement for a few days.  Please Note:  You might notice some irritation and congestion in your nose or some drainage.  This is from the oxygen used during your procedure.  There is no need for concern and it should clear up in a day or so.  SYMPTOMS TO REPORT IMMEDIATELY:  Following lower endoscopy (colonoscopy or flexible sigmoidoscopy):  Excessive amounts of blood in the stool  Significant tenderness or worsening of abdominal pains  Swelling of the abdomen that is new, acute  Fever of 100F or higher  For urgent or emergent issues, a gastroenterologist can be reached at any hour by calling [phone]. Do not use MyChart messaging for urgent concerns.    DIET:  We do recommend a small meal at first, but then you may proceed to your regular diet.  Drink plenty of fluids but you should avoid alcoholic beverages for 24 hours.  ACTIVITY:  You should plan to take it easy for the rest of today and you should NOT DRIVE or use heavy machinery until tomorrow (because of the sedation medicines used during the test).    FOLLOW UP: Our staff will call the number listed on your records the next business day following your procedure.  We will call around 7:15- 8:00 am to check on you and address any questions or concerns that you may have regarding the information given to you following your procedure. If we do not reach you, we will leave a message.     If any biopsies were taken you will be contacted by phone or by letter within the next 1-3 weeks.  Please call us at [phone] if you have not heard about the biopsies in 3 weeks.    SIGNATURES/CONFIDENTIALITY: You and/or your care partner have signed paperwork which will be entered into your electronic medical record.  These signatures attest to the  fact that that the information above on your After Visit Summary has been reviewed and is understood.  Full responsibility of the confidentiality of this discharge information lies with you and/or your care-partner.

## 2022-12-01 NOTE — Progress Notes (Signed)
GASTROENTEROLOGY PROCEDURE H&P NOTE   Primary Care Physician: Margaree Mackintosh, MD  HPI: Reginald Adkins is a 72 y.o. male who presents for Colonoscopy for surveillance of previous adenomas and also cecal colitis.  Past Medical History:  Diagnosis Date   Anxiety    Depression    Diabetes mellitus without complication (HCC)    Type II w oral agents   GERD (gastroesophageal reflux disease)    Headache    hx of migraines but have kind of subsided some   Hypertension    Stone, kidney    Renal stones;  left  Mid ureteral stone   Past Surgical History:  Procedure Laterality Date   EXTRACORPOREAL SHOCK WAVE LITHOTRIPSY  early 2000s   INNER EAR SURGERY Bilateral 1964   Multiple ear surgeries   STONE EXTRACTION WITH BASKET   early 2000's   TONSILLECTOMY      as child   Current Outpatient Medications  Medication Sig Dispense Refill   ALPRAZolam (XANAX) 0.25 MG tablet TAKE 1 TABLET(0.25 MG) BY MOUTH THREE TIMES DAILY AS NEEDED FOR ANXIETY 90 tablet 3   amLODipine (NORVASC) 5 MG tablet TAKE 1 TABLET BY MOUTH EVERY DAY 90 tablet 3   aspirin 81 MG chewable tablet Chew 81 mg by mouth.     atorvastatin (LIPITOR) 10 MG tablet TAKE 1 TABLET BY MOUTH EVERY DAY 90 tablet 3   glipiZIDE (GLUCOTROL XL) 5 MG 24 hr tablet TAKE 1 TABLET(5 MG) BY MOUTH DAILY WITH BREAKFAST 90 tablet 3   hydrochlorothiazide (HYDRODIURIL) 25 MG tablet TAKE 1 TABLET BY MOUTH DAILY 90 tablet 1   losartan (COZAAR) 100 MG tablet TAKE 1 TABLET BY MOUTH EVERY DAY 90 tablet 1   metFORMIN (GLUCOPHAGE) 500 MG tablet TAKE 1 TABLET(500 MG) BY MOUTH TWICE DAILY WITH A MEAL 180 tablet 0   metoprolol tartrate (LOPRESSOR) 25 MG tablet TAKE 1 TABLET BY MOUTH TWICE DAILY 180 tablet 3   sertraline (ZOLOFT) 50 MG tablet TAKE 1 TABLET(50 MG) BY MOUTH DAILY 90 tablet 3   No current facility-administered medications for this visit.    Current Outpatient Medications:    ALPRAZolam (XANAX) 0.25 MG tablet, TAKE 1 TABLET(0.25 MG) BY MOUTH  THREE TIMES DAILY AS NEEDED FOR ANXIETY, Disp: 90 tablet, Rfl: 3   amLODipine (NORVASC) 5 MG tablet, TAKE 1 TABLET BY MOUTH EVERY DAY, Disp: 90 tablet, Rfl: 3   aspirin 81 MG chewable tablet, Chew 81 mg by mouth., Disp: , Rfl:    atorvastatin (LIPITOR) 10 MG tablet, TAKE 1 TABLET BY MOUTH EVERY DAY, Disp: 90 tablet, Rfl: 3   glipiZIDE (GLUCOTROL XL) 5 MG 24 hr tablet, TAKE 1 TABLET(5 MG) BY MOUTH DAILY WITH BREAKFAST, Disp: 90 tablet, Rfl: 3   hydrochlorothiazide (HYDRODIURIL) 25 MG tablet, TAKE 1 TABLET BY MOUTH DAILY, Disp: 90 tablet, Rfl: 1   losartan (COZAAR) 100 MG tablet, TAKE 1 TABLET BY MOUTH EVERY DAY, Disp: 90 tablet, Rfl: 1   metFORMIN (GLUCOPHAGE) 500 MG tablet, TAKE 1 TABLET(500 MG) BY MOUTH TWICE DAILY WITH A MEAL, Disp: 180 tablet, Rfl: 0   metoprolol tartrate (LOPRESSOR) 25 MG tablet, TAKE 1 TABLET BY MOUTH TWICE DAILY, Disp: 180 tablet, Rfl: 3   sertraline (ZOLOFT) 50 MG tablet, TAKE 1 TABLET(50 MG) BY MOUTH DAILY, Disp: 90 tablet, Rfl: 3 Allergies  Allergen Reactions   Morphine And Codeine     hallucinations   Amoxicillin Itching   Family History  Problem Relation Age of Onset   Cancer  Brother        kidney cancer   Colon cancer Neg Hx    Colon polyps Neg Hx    Esophageal cancer Neg Hx    Rectal cancer Neg Hx    Stomach cancer Neg Hx    Social History   Socioeconomic History   Marital status: Married    Spouse name: Not on file   Number of children: Not on file   Years of education: Not on file   Highest education level: Not on file  Occupational History   Not on file  Tobacco Use   Smoking status: Never   Smokeless tobacco: Never  Vaping Use   Vaping Use: Never used  Substance and Sexual Activity   Alcohol use: No   Drug use: No   Sexual activity: Not on file  Other Topics Concern   Not on file  Social History Narrative   Not on file   Social Determinants of Health   Financial Resource Strain: Not on file  Food Insecurity: Not on file   Transportation Needs: Not on file  Physical Activity: Not on file  Stress: Not on file  Social Connections: Not on file  Intimate Partner Violence: Not on file    Physical Exam: There were no vitals filed for this visit. There is no height or weight on file to calculate BMI. GEN: NAD EYE: Sclerae anicteric ENT: MMM CV: Non-tachycardic GI: Soft, NT/ND NEURO:  Alert & Oriented x 3  Lab Results: No results for input(s): "WBC", "HGB", "HCT", "PLT" in the last 72 hours. BMET No results for input(s): "NA", "K", "CL", "CO2", "GLUCOSE", "BUN", "CREATININE", "CALCIUM" in the last 72 hours. LFT No results for input(s): "PROT", "ALBUMIN", "AST", "ALT", "ALKPHOS", "BILITOT", "BILIDIR", "IBILI" in the last 72 hours. PT/INR No results for input(s): "LABPROT", "INR" in the last 72 hours.   Impression / Plan: This is a 72 y.o.male who presents for Colonoscopy for surveillance of previous adenomas and also cecal colitis.  The risks and benefits of endoscopic evaluation/treatment were discussed with the patient and/or family; these include but are not limited to the risk of perforation, infection, bleeding, missed lesions, lack of diagnosis, severe illness requiring hospitalization, as well as anesthesia and sedation related illnesses.  The patient's history has been reviewed, patient examined, no change in status, and deemed stable for procedure.  The patient and/or family is agreeable to proceed.    Corliss Parish, MD [facility] Office # [phone]

## 2022-12-01 NOTE — Progress Notes (Signed)
Called to room to assist during endoscopic procedure.  Patient ID and intended procedure confirmed with present staff. Received instructions for my participation in the procedure from the performing physician.  

## 2022-12-01 NOTE — Op Note (Signed)
[facility] Patient Name: Reginald Adkins Procedure Date: 12/01/2022 9:01 AM MRN: [id number] Endoscopist: Corliss Parish , MD, [id number] Age: 72 Referring MD:  Date of Birth: 09/19/1950 Gender: Male Account #: [id number] Procedure:                Colonoscopy Indications:              Surveillance: Personal history of piecemeal removal                            of large sessile adenoma on last colonoscopy (less                            than 1 year ago), Incidental - Follow-up of colitis                            (previous cecal colitis NOS no treatment on                            colonoscopy in 2023) Medicines:                Monitored Anesthesia Care Procedure:                Pre-Anesthesia Assessment:                           - Prior to the procedure, a History and Physical                            was performed, and patient medications and                            allergies were reviewed. The patient's tolerance of                            previous anesthesia was also reviewed. The risks                            and benefits of the procedure and the sedation                            options and risks were discussed with the patient.                            All questions were answered, and informed consent                            was obtained. Prior Anticoagulants: The patient has                            taken no anticoagulant or antiplatelet agents. ASA                            Grade Assessment: II - A patient with mild systemic  disease. After reviewing the risks and benefits,                            the patient was deemed in satisfactory condition to                            undergo the procedure.                           After obtaining informed consent, the colonoscope                            was passed under direct vision. Throughout the                            procedure, the patient's blood  pressure, pulse, and                            oxygen saturations were monitored continuously. The                            CF [id number] #[id number] was introduced through the anus                            and advanced to the 3 cm into the ileum. The                            colonoscopy was performed without difficulty. The                            patient tolerated the procedure. The quality of the                            bowel preparation was adequate. The terminal ileum,                            ileocecal valve, appendiceal orifice, and rectum                            were photographed. Scope In: 9:19:45 AM Scope Out: 9:34:54 AM Scope Withdrawal Time: 0 hours 11 minutes 44 seconds  Total Procedure Duration: 0 hours 15 minutes 9 seconds  Findings:                 Skin tags were found on perianal exam.                           The digital rectal exam findings include                            hemorrhoids. Pertinent negatives include no                            palpable rectal lesions.  The terminal ileum and ileocecal valve appeared                            normal.                           Two sessile polyps were found in the descending                            colon and transverse colon. The polyps were 4 to 6                            mm in size. These polyps were removed with a cold                            snare. Resection and retrieval were complete.                           Normal mucosa was found in the entire colon                            otherwise.                           Non-bleeding non-thrombosed external and internal                            hemorrhoids were found during retroflexion, during                            perianal exam and during digital exam. The                            hemorrhoids were Grade II (internal hemorrhoids                            that prolapse but reduce spontaneously). Complications:             No immediate complications. Estimated Blood Loss:     Estimated blood loss was minimal. Impression:               - Perianal skin tags found on perianal exam.                           - Hemorrhoids found on digital rectal exam.                           - The examined portion of the ileum was normal.                           - Two 4 to 6 mm polyps in the descending colon and                            in the transverse colon, removed with a cold snare.  Resected and retrieved.                           - Normal mucosa in the entire examined colon                            otherwise.                           - Non-bleeding non-thrombosed external and internal                            hemorrhoids. Recommendation:           - The patient will be observed post-procedure,                            until all discharge criteria are met.                           - Discharge patient to home.                           - Patient has a contact number available for                            emergencies. The signs and symptoms of potential                            delayed complications were discussed with the                            patient. Return to normal activities tomorrow.                            Written discharge instructions were provided to the                            patient.                           - High fiber diet.                           - Use FiberCon 1-2 tablets PO daily.                           - Continue present medications.                           - Await pathology results.                           - Repeat colonoscopy in 3 years for surveillance                            based on pathology results due to history of  previous advanced adenoma and overall number of                            polyps removed within a year.                           - The findings and recommendations were discussed                             with the patient.                           - The findings and recommendations were discussed                            with the patient's family. Corliss Parish, MD 12/01/2022 9:40:01 AM

## 2022-12-05 ENCOUNTER — Telehealth: Payer: Self-pay

## 2022-12-05 NOTE — Telephone Encounter (Signed)
Follow up call placed, no answer. 

## 2022-12-22 ENCOUNTER — Encounter: Payer: Self-pay | Admitting: Gastroenterology

## 2022-12-26 ENCOUNTER — Other Ambulatory Visit: Payer: Self-pay | Admitting: Internal Medicine

## 2023-02-18 ENCOUNTER — Other Ambulatory Visit: Payer: Self-pay | Admitting: Internal Medicine

## 2023-02-26 ENCOUNTER — Ambulatory Visit
Admission: EM | Admit: 2023-02-26 | Discharge: 2023-02-26 | Disposition: A | Discharge status: Home / Self Care | Payer: Medicare HMO

## 2023-02-26 DIAGNOSIS — S00452A Superficial foreign body of left ear, initial encounter: Secondary | ICD-10-CM

## 2023-02-26 NOTE — ED Provider Notes (Signed)
[facility]    CSN: [id number] Arrival date & time: 02/26/23  1620      History   Chief Complaint Chief Complaint  Patient presents with   Foreign Body in Ear    HPI Reginald Adkins is a 72 y.o. male.   HPI  72 year old male with a past medical history significant for hypertension, headaches, GERD, and diabetes presents for evaluation of possible foreign body in the left ear.  He states that he is lost the tip to his left hearing aid yesterday and thinks it is still in his ear.  Past Medical History:  Diagnosis Date   Anxiety    Depression    Diabetes mellitus without complication (HCC)    Type II w oral agents   GERD (gastroesophageal reflux disease)    Headache    hx of migraines but have kind of subsided some   Hypertension    Stone, kidney    Renal stones;  left  Mid ureteral stone    Patient Active Problem List   Diagnosis Date Noted   Office hypertension 03/16/2014   Anxiety and depression 03/16/2014   Controlled diabetes mellitus type II without complication (HCC) 11/06/2012   Hypertension 03/18/2012   History of kidney stones 03/18/2012    Past Surgical History:  Procedure Laterality Date   COLONOSCOPY     EXTRACORPOREAL SHOCK WAVE LITHOTRIPSY  early 2000s   INNER EAR SURGERY Bilateral 06/12/1962   Multiple ear surgeries   STONE EXTRACTION WITH BASKET   early 2000's   TONSILLECTOMY      as child       Home Medications    Prior to Admission medications   Medication Sig Start Date End Date Taking? Authorizing Provider  ALPRAZolam (XANAX) 0.25 MG tablet TAKE 1 TABLET(0.25 MG) BY MOUTH THREE TIMES DAILY AS NEEDED FOR ANXIETY 07/04/22  Yes Baxley, Luanna Cole, MD  amLODipine (NORVASC) 5 MG tablet TAKE 1 TABLET BY MOUTH EVERY DAY 09/06/22  Yes Baxley, Luanna Cole, MD  aspirin 81 MG chewable tablet Chew 81 mg by mouth.   Yes [provider]  atorvastatin (LIPITOR) 10 MG tablet TAKE 1 TABLET BY MOUTH EVERY DAY 02/18/23  Yes Baxley, Luanna Cole, MD   glipiZIDE (GLUCOTROL XL) 5 MG 24 hr tablet TAKE 1 TABLET(5 MG) BY MOUTH DAILY WITH BREAKFAST 05/16/22  Yes Baxley, Luanna Cole, MD  hydrochlorothiazide (HYDRODIURIL) 25 MG tablet TAKE 1 TABLET BY MOUTH DAILY 12/26/22  Yes Margaree Mackintosh, MD  losartan (COZAAR) 100 MG tablet TAKE 1 TABLET BY MOUTH EVERY DAY 12/26/22  Yes Margaree Mackintosh, MD  metFORMIN (GLUCOPHAGE) 500 MG tablet TAKE 1 TABLET(500 MG) BY MOUTH TWICE DAILY WITH A MEAL 12/26/22  Yes Baxley, Luanna Cole, MD  metoprolol tartrate (LOPRESSOR) 25 MG tablet TAKE 1 TABLET BY MOUTH TWICE DAILY 02/18/23  Yes Baxley, Luanna Cole, MD  sertraline (ZOLOFT) 50 MG tablet TAKE 1 TABLET(50 MG) BY MOUTH DAILY 10/20/22  Yes Baxley, Luanna Cole, MD    Family History Family History  Problem Relation Age of Onset   Cancer Brother        kidney cancer   Colon cancer Neg Hx    Colon polyps Neg Hx    Esophageal cancer Neg Hx    Rectal cancer Neg Hx    Stomach cancer Neg Hx     Social History Social History   Tobacco Use   Smoking status: Never   Smokeless tobacco: Never  Vaping Use   Vaping  status: Never Used  Substance Use Topics   Alcohol use: No   Drug use: No     Allergies   Morphine and codeine and Amoxicillin   Review of Systems Review of Systems  HENT:  Negative for ear discharge and ear pain.        Foreign body in left ear     Physical Exam Triage Vital Signs ED Triage Vitals  Encounter Vitals Group     BP 02/26/23 1635 (!) 174/91     Systolic BP Percentile --      Diastolic BP Percentile --      Pulse Rate 02/26/23 1635 68     Resp 02/26/23 1635 16     Temp 02/26/23 1635 98.3 F (36.8 C)     Temp Source 02/26/23 1635 Oral     SpO2 02/26/23 1635 95 %     Weight 02/26/23 1634 190 lb (86.2 kg)     Height 02/26/23 1634 5\' 11"  (1.803 m)     Head Circumference --      Peak Flow --      Pain Score 02/26/23 1637 0     Pain Loc --      Pain Education --      Exclude from Growth Chart --    No data found.  Updated Vital Signs BP (!)  174/91 (BP Location: Left Arm)   Pulse 68   Temp 98.3 F (36.8 C) (Oral)   Resp 16   Ht 5\' 11"  (1.803 m)   Wt 190 lb (86.2 kg)   SpO2 95%   BMI 26.50 kg/m   Visual Acuity Right Eye Distance:   Left Eye Distance:   Bilateral Distance:    Right Eye Near:   Left Eye Near:    Bilateral Near:     Physical Exam Vitals and nursing note reviewed.  Constitutional:      Appearance: Normal appearance. He is not ill-appearing.  HENT:     Head: Normocephalic and atraumatic.     Ears:     Comments: Silicone tip visible in the left EAC. Skin:    General: Skin is warm and dry.     Capillary Refill: Capillary refill takes less than 2 seconds.  Neurological:     General: No focal deficit present.     Mental Status: He is alert and oriented to person, place, and time.      UC Treatments / Results  Labs (all labs ordered are listed, but only abnormal results are displayed) Labs Reviewed - No data to display  EKG   Radiology No results found.  Procedures Procedures (including critical care time)  Medications Ordered in UC Medications - No data to display  Initial Impression / Assessment and Plan / UC Course  I have reviewed the triage vital signs and the nursing notes.  Pertinent labs & imaging results that were available during my care of the patient were reviewed by me and considered in my medical decision making (see chart for details).   Patient is a very pleasant, nontoxic-appearing 72 year old male presenting for evaluation of possible foreign body in left ear.  On exam he does have the silicone earpiece from a hearing aid in the [facility].  This was easily removed with a pair of alligator forceps.  The EAC was then reinspected and found to be clear.   Final Clinical Impressions(s) / UC Diagnoses   Final diagnoses:  Non-penetrating foreign body in left ear canal, initial encounter  Discharge Instructions      Please return for reevaluation for any new or  worsening symptoms.     ED Prescriptions   None    PDMP not reviewed this encounter.   Becky Augusta, NP 02/26/23 1700

## 2023-02-26 NOTE — Discharge Instructions (Addendum)
Please return for reevaluation for any new or worsening symptoms.

## 2023-02-26 NOTE — ED Triage Notes (Signed)
Pt c/o poss hearing aid in L ear x1 day.

## 2023-03-13 ENCOUNTER — Other Ambulatory Visit: Payer: Medicare HMO

## 2023-03-13 ENCOUNTER — Ambulatory Visit (INDEPENDENT_AMBULATORY_CARE_PROVIDER_SITE_OTHER): Payer: Medicare HMO

## 2023-03-13 VITALS — BP 138/88 | HR 70 | Ht 70.0 in | Wt 192.0 lb

## 2023-03-13 DIAGNOSIS — E1169 Type 2 diabetes mellitus with other specified complication: Secondary | ICD-10-CM | POA: Diagnosis not present

## 2023-03-13 DIAGNOSIS — E785 Hyperlipidemia, unspecified: Secondary | ICD-10-CM | POA: Diagnosis not present

## 2023-03-13 DIAGNOSIS — I10 Essential (primary) hypertension: Secondary | ICD-10-CM | POA: Diagnosis not present

## 2023-03-13 DIAGNOSIS — E119 Type 2 diabetes mellitus without complications: Secondary | ICD-10-CM | POA: Diagnosis not present

## 2023-03-13 DIAGNOSIS — Z0000 Encounter for general adult medical examination without abnormal findings: Secondary | ICD-10-CM

## 2023-03-13 NOTE — Progress Notes (Addendum)
Subjective:   Reginald Adkins is a 72 y.o. male who presents for Medicare Annual/Subsequent visit  Visit Complete: In person  Patient Medicare AWV questionnaire was completed by the patient on 03/13/23; I have confirmed that all information answered by patient is correct and no changes since this date.  Cardiac Risk Factors include: advanced age (>45men, >24 women);male gender;hypertension     Objective:    Today's Vitals   03/13/23 0931  BP: 138/88  Pulse: 70  SpO2: 98%  Weight: 192 lb (87.1 kg)  Height: 5\' 10"  (1.778 m)  PainSc: 0-No pain   Body mass index is 27.55 kg/m.     03/07/2022   11:07 AM 06/25/2015    1:11 PM  Advanced Directives  Does Patient Have a Medical Advance Directive? No No  Would patient like information on creating a medical advance directive? No - Patient declined Yes - Educational materials given    Current Medications (verified) Outpatient Encounter Medications as of 03/13/2023  Medication Sig   ALPRAZolam (XANAX) 0.25 MG tablet TAKE 1 TABLET(0.25 MG) BY MOUTH THREE TIMES DAILY AS NEEDED FOR ANXIETY   amLODipine (NORVASC) 5 MG tablet TAKE 1 TABLET BY MOUTH EVERY DAY   aspirin 81 MG chewable tablet Chew 81 mg by mouth.   atorvastatin (LIPITOR) 10 MG tablet TAKE 1 TABLET BY MOUTH EVERY DAY   glipiZIDE (GLUCOTROL XL) 5 MG 24 hr tablet TAKE 1 TABLET(5 MG) BY MOUTH DAILY WITH BREAKFAST   hydrochlorothiazide (HYDRODIURIL) 25 MG tablet TAKE 1 TABLET BY MOUTH DAILY   losartan (COZAAR) 100 MG tablet TAKE 1 TABLET BY MOUTH EVERY DAY   metFORMIN (GLUCOPHAGE) 500 MG tablet TAKE 1 TABLET(500 MG) BY MOUTH TWICE DAILY WITH A MEAL   metoprolol tartrate (LOPRESSOR) 25 MG tablet TAKE 1 TABLET BY MOUTH TWICE DAILY   sertraline (ZOLOFT) 50 MG tablet TAKE 1 TABLET(50 MG) BY MOUTH DAILY   No facility-administered encounter medications on file as of 03/13/2023.    Allergies (verified) Morphine and codeine and Amoxicillin   History: Past Medical History:   Diagnosis Date   Anxiety    Depression    Diabetes mellitus without complication (HCC)    Type II w oral agents   GERD (gastroesophageal reflux disease)    Headache    hx of migraines but have kind of subsided some   Hypertension    Stone, kidney    Renal stones;  left  Mid ureteral stone   Past Surgical History:  Procedure Laterality Date   COLONOSCOPY     EXTRACORPOREAL SHOCK WAVE LITHOTRIPSY  early 2000s   INNER EAR SURGERY Bilateral 06/12/1962   Multiple ear surgeries   STONE EXTRACTION WITH BASKET   early 2000's   TONSILLECTOMY      as child   Family History  Problem Relation Age of Onset   Cancer Brother        kidney cancer   Colon cancer Neg Hx    Colon polyps Neg Hx    Esophageal cancer Neg Hx    Rectal cancer Neg Hx    Stomach cancer Neg Hx    Social History   Socioeconomic History   Marital status: Single    Spouse name: Not on file   Number of children: Not on file   Years of education: Not on file   Highest education level: Not on file  Occupational History   Not on file  Tobacco Use   Smoking status: Never   Smokeless tobacco: Never  Vaping Use   Vaping status: Never Used  Substance and Sexual Activity   Alcohol use: No   Drug use: No   Sexual activity: Not on file  Other Topics Concern   Not on file  Social History Narrative   Not on file   Social Determinants of Health   Financial Resource Strain: Low Risk  (03/13/2023)   Overall Financial Resource Strain (CARDIA)    Difficulty of Paying Living Expenses: Not very hard  Recent Concern: Financial Resource Strain - High Risk (03/13/2023)   Overall Financial Resource Strain (CARDIA)    Difficulty of Paying Living Expenses: Very hard  Food Insecurity: No Food Insecurity (03/13/2023)   Hunger Vital Sign    Worried About Running Out of Food in the Last Year: Never true    Ran Out of Food in the Last Year: Never true  Transportation Needs: No Transportation Needs (03/13/2023)   PRAPARE -  Administrator, Civil Service (Medical): No    Lack of Transportation (Non-Medical): No  Physical Activity: Sufficiently Active (03/13/2023)   Exercise Vital Sign    Days of Exercise per Week: 4 days    Minutes of Exercise per Session: 40 min  Stress: No Stress Concern Present (03/13/2023)   [employer] of Occupational Health - Occupational Stress Questionnaire    Feeling of Stress : Not at all  Social Connections: Moderately Integrated (03/13/2023)   Social Connection and Isolation Panel [NHANES]    Frequency of Communication with Friends and Family: More than three times a week    Frequency of Social Gatherings with Friends and Family: More than three times a week    Attends Religious Services: Never    Database administrator or Organizations: Yes    Attends Engineer, structural: More than 4 times per year    Marital Status: Married    Tobacco Counseling Counseling given: Not Answered   Clinical Intake:  Pre-visit preparation completed: Yes  Pain : No/denies pain Pain Score: 0-No pain     Nutritional Status: BMI 25 -29 Overweight Diabetes: Yes CBG done?: No Did pt. bring in CBG monitor from home?: No  How often do you need to have someone help you when you read instructions, pamphlets, or other written materials from your doctor or pharmacy?: 1 - Never     Information entered by :: Dixie Dials, CMA   Activities of Daily Living    03/13/2023    9:31 AM  In your present state of health, do you have any difficulty performing the following activities:  Hearing? 1  Vision? 0  Difficulty concentrating or making decisions? 0  Walking or climbing stairs? 0  Dressing or bathing? 0  Doing errands, shopping? 0  Preparing Food and eating ? N  Using the Toilet? N  In the past six months, have you accidently leaked urine? N  Do you have problems with loss of bowel control? N  Managing your Medications? N  Managing your Finances? N   Housekeeping or managing your Housekeeping? N    Patient Care Team: Margaree Mackintosh, MD as PCP - General (Internal Medicine)  Indicate any recent Medical Services you may have received from other than [facility] providers in the past year (date may be approximate).     Assessment:   This is a routine wellness examination for Reginald Adkins.  Hearing/Vision screen No results found.   Goals Addressed   None    Depression Screen    03/13/2023  9:29 AM 09/05/2022   10:29 AM 03/07/2022   11:07 AM 03/07/2022   11:06 AM 06/27/2021    9:49 AM 12/23/2020   11:14 AM 06/24/2020    2:48 PM  PHQ 2/9 Scores  PHQ - 2 Score 0 0 0 0 0 1 0  PHQ- 9 Score   1  1 1  0    Fall Risk    03/13/2023    9:33 AM 09/05/2022   10:28 AM 03/07/2022   11:06 AM 06/27/2021    9:49 AM 06/24/2020    2:48 PM  Fall Risk   Falls in the past year? 0 0 0 0 0  Number falls in past yr: 0 0 0 0 0  Injury with Fall? 0 0 0 0 0  Risk for fall due to : No Fall Risks No Fall Risks No Fall Risks No Fall Risks   Follow up Falls evaluation completed Falls prevention discussed Falls evaluation completed  Falls evaluation completed    MEDICARE RISK AT HOME: Medicare Risk at Home Any stairs in or around the home?: No If so, are there any without handrails?: No Home free of loose throw rugs in walkways, pet beds, electrical cords, etc?: No Adequate lighting in your home to reduce risk of falls?: Yes Life alert?: Yes Use of a cane, walker or w/c?: No Grab bars in the bathroom?: No Shower chair or bench in shower?: No Elevated toilet seat or a handicapped toilet?: No  TIMED UP AND GO:  Was the test performed?  No    Cognitive Function:        03/13/2023    9:36 AM 03/07/2022   11:11 AM  6CIT Screen  What Year? 0 points 0 points  What month? 0 points 0 points  What time? 0 points 0 points  Count back from 20 0 points 0 points  Months in reverse 0 points 0 points  Repeat phrase 0 points 0 points  Total Score 0 points 0  points    Immunizations Immunization History  Administered Date(s) Administered   Fluad Trivalent(High Dose 65+) 02/16/2023   Influenza Split 03/07/2013   Influenza, High Dose Seasonal PF 02/22/2017, 03/13/2018, 02/05/2019   Influenza,inj,Quad PF,6+ Mos 03/09/2014, 03/22/2020, 03/16/2021   Influenza-Unspecified 02/12/2015, 03/13/2016, 02/22/2017   PFIZER(Purple Top)SARS-COV-2 Vaccination 08/05/2019, 08/26/2019, 03/08/2020, 02/16/2023   Pneumococcal Conjugate-13 02/22/2017   Pneumococcal Polysaccharide-23 03/13/2018   Td 02/16/2023   Tdap 04/05/2012   Zoster, Live 02/12/2015    TDAP status: Up to date  Flu Vaccine status: Up to date  Pneumococcal vaccine status: Up to date  Covid-19 vaccine status: Completed vaccines  Qualifies for Shingles Vaccine? Yes   Zostavax completed Yes   Shingrix Completed?: No.    Education has been provided regarding the importance of this vaccine. Patient has been advised to call insurance company to determine out of pocket expense if they have not yet received this vaccine. Advised may also receive vaccine at local pharmacy or Health Dept. Verbalized acceptance and understanding.  Screening Tests Health Maintenance  Topic Date Due   Zoster Vaccines- Shingrix (1 of 2) 04/08/2001   OPHTHALMOLOGY EXAM  02/28/2018   Diabetic kidney evaluation - eGFR measurement  03/03/2023   Medicare Annual Wellness (AWV)  03/08/2023   HEMOGLOBIN A1C  03/03/2023   COVID-19 Vaccine (5 - 2023-24 season) 04/13/2023   Diabetic kidney evaluation - Urine ACR  09/05/2023   FOOT EXAM  09/05/2023   Colonoscopy  11/30/2025   DTaP/Tdap/Td (3 - Td or  Tdap) 02/15/2033   Pneumonia Vaccine 25+ Years old  Completed   INFLUENZA VACCINE  Completed   HPV VACCINES  Aged Out   Hepatitis C Screening  Discontinued    Health Maintenance  Health Maintenance Due  Topic Date Due   Zoster Vaccines- Shingrix (1 of 2) 04/08/2001   OPHTHALMOLOGY EXAM  02/28/2018   Diabetic kidney  evaluation - eGFR measurement  03/03/2023   Medicare Annual Wellness (AWV)  03/08/2023   HEMOGLOBIN A1C  03/03/2023    Colorectal cancer screening: Type of screening: Colonoscopy. Completed 12/01/22. Repeat every 3 years  Lung Cancer Screening: (Low Dose CT Chest recommended if Age 78-80 years, 20 pack-year currently smoking OR have quit w/in 15years.) does not qualify.     Additional Screening:  Hepatitis C Screening: does not qualify;   Vision Screening: Recommended annual ophthalmology exams for early detection of glaucoma and other disorders of the eye. Is the patient up to date with their annual eye exam?  No   If pt is not established with a provider, would they like to be referred to a provider to establish care? No .   Dental Screening: Recommended annual dental exams for proper oral hygiene  Diabetic Foot Exam: Diabetic Foot Exam: Completed 03/13/23  Community Resource Referral / Chronic Care Management: CRR required this visit?  No   CCM required this visit?  No     Plan:     I have personally reviewed and noted the following in the patient's chart:   Medical and social history Use of alcohol, tobacco or illicit drugs  Current medications and supplements including opioid prescriptions. Patient is not currently taking opioid prescriptions. Functional ability and status Nutritional status Physical activity Advanced directives List of other physicians Hospitalizations, surgeries, and ER visits in previous 12 months Vitals Screenings to include cognitive, depression, and falls Referrals and appointments  In addition, I have reviewed and discussed with patient certain preventive protocols, quality metrics, and best practice recommendations. A written personalized care plan for preventive services as well as general preventive health recommendations were provided to patient.     Exavior Kimmons Sharlyne Cai, CMA   03/13/2023   After Visit Summary: (In Person-Declined)  Patient declined AVS at this time.   IMargaree Mackintosh, MD, have reviewed all documentation for this visit. The documentation on 03/13/23 for the exam, diagnosis, procedures, and orders are all accurate and complete.

## 2023-03-14 LAB — HEMOGLOBIN A1C
Hgb A1c MFr Bld: 6.3 %{Hb} — ABNORMAL HIGH (ref <5.7)
Mean Plasma Glucose: 134 mg/dL
eAG (mmol/L): 7.4 mmol/L

## 2023-03-14 LAB — LIPID PANEL
Cholesterol: 119 mg/dL (ref <200)
HDL: 37 mg/dL — ABNORMAL LOW (ref >=40)
LDL Cholesterol (Calc): 66 mg/dL
Non-HDL Cholesterol (Calc): 82 mg/dL (ref <130)
Total CHOL/HDL Ratio: 3.2 (calc) (ref <5.0)
Triglycerides: 81 mg/dL (ref <150)

## 2023-03-14 LAB — MICROALBUMIN / CREATININE URINE RATIO
Creatinine, Urine: 141 mg/dL (ref 20–320)
Microalb Creat Ratio: 11 mg/g{creat} (ref <30)
Microalb, Ur: 1.5 mg/dL

## 2023-03-14 LAB — HEPATIC FUNCTION PANEL
AG Ratio: 1.5 (calc) (ref 1.0–2.5)
ALT: 22 U/L (ref 9–46)
AST: 23 U/L (ref 10–35)
Albumin: 4.4 g/dL (ref 3.6–5.1)
Alkaline phosphatase (APISO): 57 U/L (ref 35–144)
Bilirubin, Direct: 0.2 mg/dL (ref 0.0–0.2)
Globulin: 3 g/dL (ref 1.9–3.7)
Indirect Bilirubin: 0.6 mg/dL (ref 0.2–1.2)
Total Bilirubin: 0.8 mg/dL (ref 0.2–1.2)
Total Protein: 7.4 g/dL (ref 6.1–8.1)

## 2023-03-15 ENCOUNTER — Ambulatory Visit: Payer: Medicare HMO

## 2023-03-15 ENCOUNTER — Ambulatory Visit: Payer: Medicare HMO | Admitting: Internal Medicine

## 2023-03-15 ENCOUNTER — Encounter: Payer: Self-pay | Admitting: Internal Medicine

## 2023-03-15 VITALS — BP 140/90 | HR 68 | Ht 70.0 in | Wt 195.0 lb

## 2023-03-15 DIAGNOSIS — Z8659 Personal history of other mental and behavioral disorders: Secondary | ICD-10-CM

## 2023-03-15 DIAGNOSIS — E1169 Type 2 diabetes mellitus with other specified complication: Secondary | ICD-10-CM | POA: Diagnosis not present

## 2023-03-15 DIAGNOSIS — E119 Type 2 diabetes mellitus without complications: Secondary | ICD-10-CM

## 2023-03-15 DIAGNOSIS — E785 Hyperlipidemia, unspecified: Secondary | ICD-10-CM

## 2023-03-15 DIAGNOSIS — H903 Sensorineural hearing loss, bilateral: Secondary | ICD-10-CM | POA: Diagnosis not present

## 2023-03-15 DIAGNOSIS — Z7984 Long term (current) use of oral hypoglycemic drugs: Secondary | ICD-10-CM

## 2023-03-15 DIAGNOSIS — I10 Essential (primary) hypertension: Secondary | ICD-10-CM | POA: Diagnosis not present

## 2023-03-15 MED ORDER — ALPRAZOLAM 0.25 MG PO TABS: 0.2500 mg | ORAL_TABLET | Freq: Two times a day (BID) | ORAL | 3 refills | Status: DC

## 2023-03-15 NOTE — Patient Instructions (Addendum)
Discussed diet. Gets exercise. 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 Continue same meds and return for medicare wellness and PE in 6 months. Xanax refilled for anxiety at pt request. Vaccines discussed.

## 2023-03-26 ENCOUNTER — Other Ambulatory Visit: Payer: Self-pay | Admitting: Internal Medicine

## 2023-03-26 ENCOUNTER — Telehealth: Payer: Self-pay

## 2023-03-26 NOTE — Telephone Encounter (Signed)
Left message to call back, need CMP drawn per insurance.

## 2023-03-27 ENCOUNTER — Other Ambulatory Visit: Payer: Medicare HMO

## 2023-03-27 DIAGNOSIS — E119 Type 2 diabetes mellitus without complications: Secondary | ICD-10-CM

## 2023-03-28 LAB — COMPLETE METABOLIC PANEL WITH GFR
AG Ratio: 1.6 (calc) (ref 1.0–2.5)
ALT: 25 U/L (ref 9–46)
AST: 22 U/L (ref 10–35)
Albumin: 4.3 g/dL (ref 3.6–5.1)
Alkaline phosphatase (APISO): 64 U/L (ref 35–144)
BUN: 17 mg/dL (ref 7–25)
CO2: 29 mmol/L (ref 20–32)
Calcium: 9.7 mg/dL (ref 8.6–10.3)
Chloride: 103 mmol/L (ref 98–110)
Creat: 1.27 mg/dL (ref 0.70–1.28)
Globulin: 2.7 g/dL (ref 1.9–3.7)
Glucose, Bld: 117 mg/dL — ABNORMAL HIGH (ref 65–99)
Potassium: 4.4 mmol/L (ref 3.5–5.3)
Sodium: 143 mmol/L (ref 135–146)
Total Bilirubin: 0.7 mg/dL (ref 0.2–1.2)
Total Protein: 7 g/dL (ref 6.1–8.1)
eGFR: 60 mL/min/{1.73_m2} (ref >=60)

## 2023-05-14 ENCOUNTER — Other Ambulatory Visit: Payer: Self-pay | Admitting: Internal Medicine

## 2023-05-14 NOTE — Telephone Encounter (Signed)
Copied from CRM [id number]. Topic: Clinical - Medication Refill >> May 14, 2023 11:18 AM Dondra Prader A wrote: Most Recent Primary Care Visit:  Provider: [facility]  Department: [facility]  Visit Type: LAB 5  Date: 03/27/2023  Medication: glipiZIDE (GLUCOTROL XL) 5 MG  Has the patient contacted their pharmacy? Yes - Pt stated the pharmacy told him to contact his PCP (Agent: If no, request that the patient contact the pharmacy for the refill. If patient does not wish to contact the pharmacy document the reason why and proceed with request.) (Agent: If yes, when and what did the pharmacy advise?)  Is this the correct pharmacy for this prescription? Yes If no, delete pharmacy and type the correct one.  This is the patient's preferred pharmacy:  [id number], [place] - [address] [facility] [address] [place] [address] Phone: [phone] Fax: [phone]   Has the prescription been filled recently? No  Is the patient out of the medication? Yes  Has the patient been seen for an appointment in the last year OR does the patient have an upcoming appointment? Yes  Can we respond through MyChart? No  Agent: Please be advised that Rx refills may take up to 3 business days. We ask that you follow-up with your pharmacy.

## 2023-05-14 NOTE — Telephone Encounter (Signed)
Last OV 03/15/2023 Last labs 03/13/2023 Next appointment 09/18/2023

## 2023-05-16 ENCOUNTER — Ambulatory Visit (INDEPENDENT_AMBULATORY_CARE_PROVIDER_SITE_OTHER): Payer: Medicare HMO | Admitting: Internal Medicine

## 2023-05-16 ENCOUNTER — Encounter: Payer: Self-pay | Admitting: Internal Medicine

## 2023-05-16 VITALS — BP 180/110 | HR 70 | Ht 70.0 in | Wt 195.0 lb

## 2023-05-16 DIAGNOSIS — I10 Essential (primary) hypertension: Secondary | ICD-10-CM

## 2023-05-16 DIAGNOSIS — H6121 Impacted cerumen, right ear: Secondary | ICD-10-CM

## 2023-05-16 DIAGNOSIS — F411 Generalized anxiety disorder: Secondary | ICD-10-CM

## 2023-05-16 DIAGNOSIS — E119 Type 2 diabetes mellitus without complications: Secondary | ICD-10-CM

## 2023-05-16 MED ORDER — NEOMYCIN-POLYMYXIN-HC 1 % OT SOLN: 3.0000 [drp] | Freq: Four times a day (QID) | OTIC | 0 refills | Status: DC

## 2023-05-16 MED ORDER — DOXYCYCLINE HYCLATE 100 MG PO TABS: 100.0000 mg | ORAL_TABLET | Freq: Two times a day (BID) | ORAL | 0 refills | Status: DC

## 2023-05-16 NOTE — Patient Instructions (Addendum)
Cerumen was removed from right external ear canal with curette.  Slight bleeding ensued.  He will use Cortisporin otic suspension and right external ear canal 4 drops 3-4 times daily for 5 to 7 days.  He will also take doxycycline 100 mg twice daily for 7 days.  Monitor blood pressure at home.

## 2023-05-16 NOTE — Progress Notes (Signed)
   Subjective:    Patient ID: Reginald Adkins, male    DOB: [birth year], 72 y.o.   MRN: [id number]  HPI Patient came into office without an appointment today complaining of right ear discomfort and decreased hearing.  He attempted to remove earwax from his right external ear canal.  Says he was able to get some out but does not feel that he got it all out.  In the remote past he had ear surgery at [facility] for an ear problem a number of years ago when he was young.  He is concerned he may have an ear infection.  He has no fever, cough, cold, chills or respiratory congestion.  He does not wear hearing aid.  He does not smoke or consume alcohol.  He is retired.  He resides alone.  Past medical history indicates she had multiple ear surgeries as a child.  He has a history of essential hypertension, anxiety and depression.      Review of Systems no headache, nausea, vomiting, fever, chills     Objective:   Physical Exam Vital signs reviewed.  He is afebrile.  Blood pressure 180/100.  He is anxious.  Pulse 70 and regular.  He is afebrile.  Pulse oximetry 98%.  Weight 195 pounds.  BMI 27.98  Seen in no acute distress but anxious.  Skin is warm and dry.  Right TM is obscured by cerumen in external ear canal which was removed with a curette.  Some bleeding and seen in the external ear canal.       Assessment & Plan:   Excessive cerumen right external ear canal which was removed with curette.  He will use Cortisporin otic suspension 4 drops in right ear canal 3-4 times a day for 3 to 5 days and take doxycycline 100 mg twice daily for 7 days.  If earwax issues persist he can be referred to ENT physician.  Anxiety state-has alprazolam prescribed for anxiety and also takes Zoloft  Elevated blood pressure reading.  Manage take medications as prescribed and monitor blood pressure at home.  Type 2 diabetes mellitus  Hypertension-generally well-controlled when he is not anxious  Plan:  Patient was prescribed Cortisporin otic suspension to use 4 drops in right external ear canal 3-4 times a day for 3 to 5 days.  Take doxycycline 100 mg twice daily for 7 days if earwax obstruction symptoms continue, he can be referred to ENT for removal of cerumen.  He used to take his blood pressure medications when he goes home and monitor his blood pressure at home.

## 2023-07-03 ENCOUNTER — Other Ambulatory Visit: Payer: Self-pay | Admitting: Internal Medicine

## 2023-08-29 NOTE — Progress Notes (Signed)
 Patient Care Team: Sylvan Evener, MD as PCP - General (Internal Medicine)  Visit Date: 09/18/23  Subjective:   Chief Complaint  Patient presents with   Follow-up   6 month   Vitals:   09/18/23 0928 09/18/23 1007  BP: (!) 142/90 132/86   Patient GN:[id number] [name],Male DOB:December 15, 1950,73 y.o. MVH:[id number]   73 y.o. Male presents today for 6 months follow-up for Diabetes Mellitus, type II; Hyperlipidemia; Hypertension. Patient has a past medical history of Anxiety/Depression; GERD; Headaches; and Kidney Stone. Seen 03/13/2023 for his annual MWV, seen again 03/15/2023 for f/u of his chronic issues and 05/16/2023 for excessive ear wax in his right ear, otherwise hasn't been seen in this office or any other office since.   He says that he has been having some congestion and cough with mucus that was previously discolored but is whitish-clear now.  History of Hypertension treated with Amlodipine 5 mg daily, Losartan 100 mg daily, Metoprolol tartrate 25 mg twice daily, and HCTZ 25 mg daily. Blood Pressure: hypertensive today at 142/90, on recheck 132/86.  History of Hyperlipidemia treated with  Atorvastatin 10 mg daily. 09/17/2023 Lipid Panel with HDL 29, down from 37 in 03/2023.  History of Diabetes Mellitus, type II treated with Metformin 500 mg twice daily and Glipizide 5 mg daily. 09/17/2023 HgbA1c 6.6, elevated from 6.3; Blood Glucose 154, elevated from 117 in 03/2023.   From C-MET on 09/17/2023 his AST is elevated at 42 from 22 in 03/2023.   History of Anxiety treated with 0.25 mg Xanax twice daily.  Vaccine Counseling: UTD on Covid-19  02/16/2023 through [facility].  Past Medical History:  Diagnosis Date   Anxiety    Depression    Diabetes mellitus without complication (HCC)    Type II w oral agents   GERD (gastroesophageal reflux disease)    Headache    hx of migraines but have kind of subsided some   Hypertension    Stone, kidney    Renal stones;  left  Mid ureteral stone     Allergies  Allergen Reactions   Morphine And Codeine     hallucinations   Amoxicillin Itching    Family History  Problem Relation Age of Onset   Cancer Brother        kidney cancer   Colon cancer Neg Hx    Colon polyps Neg Hx    Esophageal cancer Neg Hx    Rectal cancer Neg Hx    Stomach cancer Neg Hx    Social History   Social History Narrative   Not on file  Reginald Adkins. He provides some care-taking. Review of Systems  HENT:  Positive for congestion (w/ mucus that was previously discolored, now clear).   Respiratory:  Positive for cough.   All other systems reviewed and are negative.    Objective:  Vitals: BP 132/86   Pulse 67   Temp 99.7 F (37.6 C) (Temporal)   Ht 5\' 11"  (1.803 m)   Wt 190 lb 1.9 oz (86.2 kg)   SpO2 93%   BMI 26.52 kg/m   Physical Exam Vitals and nursing note reviewed.  Constitutional:      General: He is not in acute distress.    Appearance: Normal appearance. He is not ill-appearing.  HENT:     Head: Normocephalic and atraumatic.     Right Ear: Tympanic membrane, ear canal and external ear normal. Decreased hearing (improved since December after having wax removed) noted.  Left Ear: Tympanic membrane, ear canal and external ear normal. Decreased hearing noted.     Ears:     Comments: Some wax in RIGHT canal    Mouth/Throat:     Mouth: Mucous membranes are moist.     Pharynx: Oropharynx is clear. No oropharyngeal exudate or posterior oropharyngeal erythema.  Pulmonary:     Effort: Pulmonary effort is normal.     Breath sounds: Normal breath sounds. No wheezing, rhonchi or rales.  Lymphadenopathy:     Cervical: No cervical adenopathy.  Skin:    General: Skin is warm and dry.  Neurological:     Mental Status: He is alert and oriented to person, place, and time. Mental status is at baseline.  Psychiatric:        Mood and Affect: Mood normal.        Behavior: Behavior normal.        Thought Content: Thought content  normal.        Judgment: Judgment normal.    Results:  Studies Obtained And Personally Reviewed By Me: Diabetic Foot Exam - Simple   Simple Foot Form Diabetic Foot exam was performed with the following findings: Yes 09/18/2023 10:00 AM  Visual Inspection No deformities, no ulcerations, no other skin breakdown bilaterally: Yes See comments: Yes Sensation Testing Intact to touch and monofilament testing bilaterally: Yes Pulse Check See comments: Yes Comments VISUAL INSPECTION: Bruise on RIGHT TOE from traumatic contact  PULSE CHECK: PT Pulse 2+, DP Pulse 0 bilaterally   Labs:     Component Value Date/Time   NA 140 09/17/2023 0910   K 3.8 09/17/2023 0910   CL 99 09/17/2023 0910   CO2 31 09/17/2023 0910   GLUCOSE 154 (H) 09/17/2023 0910   BUN 20 09/17/2023 0910   CREATININE 1.21 09/17/2023 0910   CALCIUM 8.9 09/17/2023 0910   PROT 7.1 09/17/2023 0910   ALBUMIN 4.5 07/07/2016 1230   AST 42 (H) 09/17/2023 0910   ALT 38 09/17/2023 0910   ALKPHOS 60 07/07/2016 1230   BILITOT 0.5 09/17/2023 0910   GFRNONAA 67 12/18/2019 0925   GFRAA 78 12/18/2019 0925    Lab Results  Component Value Date   WBC 6.0 03/02/2022   HGB 14.9 03/02/2022   HCT 43.7 03/02/2022   MCV 93.8 03/02/2022   PLT 199 03/02/2022   Lab Results  Component Value Date   CHOL 97 09/17/2023   HDL 29 (L) 09/17/2023   LDLCALC 51 09/17/2023   TRIG 85 09/17/2023   CHOLHDL 3.3 09/17/2023   Lab Results  Component Value Date   HGBA1C 6.6 (H) 09/17/2023    Lab Results  Component Value Date   PSA 0.18 03/02/2022   PSA 0.17 12/20/2020   PSA 0.2 12/18/2019     Assessment & Plan:   Congestion; Cough with mucus that was previously discolored but is whitish-clear now. Sent in 250 mg Azithromycin - take 2 tablets on Day 1 and 1 tablet on Days 2-5.  Hypertension treated with Amlodipine 5 mg daily, Losartan 100 mg daily, Metoprolol tartrate 25 mg twice daily, and HCTZ 25 mg daily. Blood Pressure: hypertensive  today at 142/90, on recheck 132/86.  Hyperlipidemia treated with  Atorvastatin 10 mg daily. 09/17/2023 Lipid Panel with HDL 29, down from 37 in 03/2023.  Diabetes Mellitus, type II treated with Metformin 500 mg twice daily and Glipizide 5 mg daily. 09/17/2023 HgbA1c 6.6, elevated from 6.3; Blood Glucose 154, elevated from 117 in 03/2023.   From C-MET on  09/17/2023 his AST is elevated at 42 from 22 in 03/2023.   Anxiety treated with 0.25 mg Xanax twice daily. Refilled today.   Vaccine Counseling: UTD on Covid-19  02/16/2023 through [facility].   I,Emily Lagle,acting as a Neurosurgeon for Sylvan Evener, MD.,have documented all relevant documentation on the behalf of Sylvan Evener, MD,as directed by  Sylvan Evener, MD while in the presence of Sylvan Evener, MD.   I, Sylvan Evener, MD, have reviewed all documentation for this visit. The documentation on 09/23/23 for the exam, diagnosis, procedures, and orders are all accurate and complete.

## 2023-09-17 ENCOUNTER — Other Ambulatory Visit: Payer: Medicare HMO

## 2023-09-17 DIAGNOSIS — E785 Hyperlipidemia, unspecified: Secondary | ICD-10-CM | POA: Diagnosis not present

## 2023-09-17 DIAGNOSIS — E1169 Type 2 diabetes mellitus with other specified complication: Secondary | ICD-10-CM

## 2023-09-17 DIAGNOSIS — E119 Type 2 diabetes mellitus without complications: Secondary | ICD-10-CM

## 2023-09-17 DIAGNOSIS — I10 Essential (primary) hypertension: Secondary | ICD-10-CM | POA: Diagnosis not present

## 2023-09-18 ENCOUNTER — Encounter: Payer: Self-pay | Admitting: Internal Medicine

## 2023-09-18 ENCOUNTER — Ambulatory Visit: Payer: Medicare HMO | Admitting: Internal Medicine

## 2023-09-18 VITALS — BP 132/86 | HR 67 | Temp 99.7°F | Ht 71.0 in | Wt 190.1 lb

## 2023-09-18 DIAGNOSIS — I10 Essential (primary) hypertension: Secondary | ICD-10-CM

## 2023-09-18 DIAGNOSIS — E785 Hyperlipidemia, unspecified: Secondary | ICD-10-CM

## 2023-09-18 DIAGNOSIS — Z8659 Personal history of other mental and behavioral disorders: Secondary | ICD-10-CM

## 2023-09-18 DIAGNOSIS — E119 Type 2 diabetes mellitus without complications: Secondary | ICD-10-CM | POA: Diagnosis not present

## 2023-09-18 DIAGNOSIS — E1169 Type 2 diabetes mellitus with other specified complication: Secondary | ICD-10-CM

## 2023-09-18 DIAGNOSIS — Z7984 Long term (current) use of oral hypoglycemic drugs: Secondary | ICD-10-CM | POA: Diagnosis not present

## 2023-09-18 DIAGNOSIS — R059 Cough, unspecified: Secondary | ICD-10-CM | POA: Diagnosis not present

## 2023-09-18 DIAGNOSIS — F419 Anxiety disorder, unspecified: Secondary | ICD-10-CM

## 2023-09-18 LAB — COMPLETE METABOLIC PANEL WITHOUT GFR
AG Ratio: 1.6 (calc) (ref 1.0–2.5)
ALT: 38 U/L (ref 9–46)
AST: 42 U/L — ABNORMAL HIGH (ref 10–35)
Albumin: 4.4 g/dL (ref 3.6–5.1)
Alkaline phosphatase (APISO): 53 U/L (ref 35–144)
BUN: 20 mg/dL (ref 7–25)
CO2: 31 mmol/L (ref 20–32)
Calcium: 8.9 mg/dL (ref 8.6–10.3)
Chloride: 99 mmol/L (ref 98–110)
Creat: 1.21 mg/dL (ref 0.70–1.28)
Globulin: 2.7 g/dL (ref 1.9–3.7)
Glucose, Bld: 154 mg/dL — ABNORMAL HIGH (ref 65–99)
Potassium: 3.8 mmol/L (ref 3.5–5.3)
Sodium: 140 mmol/L (ref 135–146)
Total Bilirubin: 0.5 mg/dL (ref 0.2–1.2)
Total Protein: 7.1 g/dL (ref 6.1–8.1)

## 2023-09-18 LAB — LIPID PANEL
Cholesterol: 97 mg/dL (ref <200)
HDL: 29 mg/dL — ABNORMAL LOW (ref >=40)
LDL Cholesterol (Calc): 51 mg/dL
Non-HDL Cholesterol (Calc): 68 mg/dL (ref <130)
Total CHOL/HDL Ratio: 3.3 (calc) (ref <5.0)
Triglycerides: 85 mg/dL (ref <150)

## 2023-09-18 LAB — HEMOGLOBIN A1C
Hgb A1c MFr Bld: 6.6 %{Hb} — ABNORMAL HIGH (ref <5.7)
Mean Plasma Glucose: 143 mg/dL
eAG (mmol/L): 7.9 mmol/L

## 2023-09-18 MED ORDER — ALPRAZOLAM 0.25 MG PO TABS: 0.2500 mg | ORAL_TABLET | Freq: Two times a day (BID) | ORAL | 5 refills | Status: DC | PRN

## 2023-09-18 MED ORDER — AZITHROMYCIN 250 MG PO TABS: ORAL_TABLET | ORAL | 0 refills | Status: AC

## 2023-09-23 NOTE — Patient Instructions (Addendum)
 Take Zithromax Z apk as directed 2 tabs day 1 followed by one tab days 2-5. Continue current meds as previously prescribed. Watch diet a bit. Continue xanax for anxiety. RTC in 6 months.

## 2023-09-25 ENCOUNTER — Other Ambulatory Visit: Payer: Self-pay

## 2023-09-25 ENCOUNTER — Other Ambulatory Visit: Payer: Self-pay | Admitting: Internal Medicine

## 2023-09-25 DIAGNOSIS — I10 Essential (primary) hypertension: Secondary | ICD-10-CM

## 2023-09-25 MED ORDER — AMLODIPINE BESYLATE 5 MG PO TABS: 5.0000 mg | ORAL_TABLET | Freq: Every day | ORAL | 3 refills | Status: AC

## 2023-09-25 NOTE — Telephone Encounter (Signed)
 Copied from CRM [id number]. Topic: Clinical - Medication Refill >> Sep 25, 2023  9:48 AM Baldemar Lev wrote: Most Recent Primary Care Visit:  Provider: Sylvan Evener  Department: [facility]  Visit Type: OFFICE VISIT  Date: 09/18/2023  Medication: losartan (COZAAR) 100 MG tablet   Has the patient contacted their pharmacy? Yes (Agent: If no, request that the patient contact the pharmacy for the refill. If patient does not wish to contact the pharmacy document the reason why and proceed with request.) (Agent: If yes, when and what did the pharmacy advise?)  Is this the correct pharmacy for this prescription? Yes If no, delete pharmacy and type the correct one.  This is the patient's preferred pharmacy:   [facility] DR[facility]ORE #[id number] [facility], [place] - [address] AT [facility] OF [address] [place] [address] Phone: [phone] Fax: [phone]  Has the prescription been filled recently? Yes  Is the patient out of the medication? Yes  Has the patient been seen for an appointment in the last year OR does the patient have an upcoming appointment? Yes  Can we respond through MyChart? Yes  Agent: Please be advised that Rx refills may take up to 3 business days. We ask that you follow-up with your pharmacy.

## 2023-09-30 ENCOUNTER — Other Ambulatory Visit: Payer: Self-pay | Admitting: Internal Medicine

## 2023-10-01 ENCOUNTER — Other Ambulatory Visit: Payer: Self-pay

## 2023-10-01 DIAGNOSIS — E119 Type 2 diabetes mellitus without complications: Secondary | ICD-10-CM

## 2023-10-01 MED ORDER — METFORMIN HCL 500 MG PO TABS: 500.0000 mg | ORAL_TABLET | Freq: Two times a day (BID) | ORAL | 0 refills | Status: DC

## 2023-10-04 ENCOUNTER — Other Ambulatory Visit: Payer: Self-pay | Admitting: Internal Medicine

## 2023-10-04 NOTE — Telephone Encounter (Signed)
 Copied from CRM [id number]. Topic: Clinical - Medication Refill >> Oct 04, 2023  2:44 PM Elle L wrote: Most Recent Primary Care Visit:  Provider: Sylvan Evener  Department: [facility]  Visit Type: OFFICE VISIT  Date: 09/18/2023  Medication: metFORMIN  (GLUCOPHAGE ) 500 MG tablet AND losartan  (COZAAR ) 100 MG tablet   Unable to pend Metformin  due to this error, "Enter the details for this reorder. Duplications may exist in the current medication list."   Has the patient contacted their pharmacy? Yes  Is this the correct pharmacy for this prescription? Yes If no, delete pharmacy and type the correct one.  This is the patient's preferred pharmacy:  [facility] DR[facility]ORE #[id number] [facility], [place] - [address] [facility] [address] AT [facility] OF [address] [place] [address] Phone: [phone] Fax: [phone]   Has the prescription been filled recently? No  Is the patient out of the medication? No  Has the patient been seen for an appointment in the last year OR does the patient have an upcoming appointment? Yes  Can we respond through MyChart? No  Agent: Please be advised that Rx refills may take up to 3 business days. We ask that you follow-up with your pharmacy.

## 2023-10-16 ENCOUNTER — Other Ambulatory Visit: Payer: Self-pay | Admitting: Internal Medicine

## 2023-10-23 DIAGNOSIS — E119 Type 2 diabetes mellitus without complications: Secondary | ICD-10-CM | POA: Diagnosis not present

## 2023-10-23 DIAGNOSIS — H524 Presbyopia: Secondary | ICD-10-CM | POA: Diagnosis not present

## 2023-10-23 LAB — HM DIABETES EYE EXAM

## 2023-12-31 ENCOUNTER — Other Ambulatory Visit: Payer: Self-pay | Admitting: Internal Medicine

## 2023-12-31 DIAGNOSIS — E119 Type 2 diabetes mellitus without complications: Secondary | ICD-10-CM

## 2024-01-03 ENCOUNTER — Other Ambulatory Visit: Payer: Self-pay | Admitting: Internal Medicine

## 2024-01-03 MED ORDER — METFORMIN HCL 500 MG PO TABS: 500.0000 mg | ORAL_TABLET | Freq: Two times a day (BID) | ORAL | 3 refills | Status: DC

## 2024-01-03 MED ORDER — LOSARTAN POTASSIUM 100 MG PO TABS: 100.0000 mg | ORAL_TABLET | Freq: Every day | ORAL | 3 refills | Status: DC

## 2024-01-03 NOTE — Telephone Encounter (Signed)
 Copied from CRM [id number]. Topic: Clinical - Medication Refill >> Jan 03, 2024  3:27 PM Sophia H wrote: Medication: losartan  (COZAAR ) 100 MG tablet metFORMIN  (GLUCOPHAGE ) 500 MG tablet  Has the patient contacted their pharmacy? Yes, told to contact office   This is the patient's preferred pharmacy:  [facility]ORE #[id number] [facility], [place] - [address] [address] AT [facility] OF [address] [facility] [place] [address] Phone: [phone] Fax: [phone]  Is this the correct pharmacy for this prescription? Yes If no, delete pharmacy and type the correct one.   Has the prescription been filled recently? Yes  Is the patient out of the medication? Yes  Has the patient been seen for an appointment in the last year OR does the patient have an upcoming appointment? Yes, 04/01/24  Can we respond through MyChart? No, phone call   Agent: Please be advised that Rx refills may take up to 3 business days. We ask that you follow-up with your pharmacy.

## 2024-02-04 ENCOUNTER — Other Ambulatory Visit: Payer: Self-pay | Admitting: Internal Medicine

## 2024-02-12 ENCOUNTER — Other Ambulatory Visit: Payer: Self-pay | Admitting: Internal Medicine

## 2024-03-28 ENCOUNTER — Other Ambulatory Visit

## 2024-03-28 DIAGNOSIS — E1169 Type 2 diabetes mellitus with other specified complication: Secondary | ICD-10-CM

## 2024-03-28 DIAGNOSIS — I10 Essential (primary) hypertension: Secondary | ICD-10-CM

## 2024-03-28 DIAGNOSIS — Z125 Encounter for screening for malignant neoplasm of prostate: Secondary | ICD-10-CM | POA: Diagnosis not present

## 2024-03-28 DIAGNOSIS — Z0000 Encounter for general adult medical examination without abnormal findings: Secondary | ICD-10-CM

## 2024-03-28 DIAGNOSIS — E119 Type 2 diabetes mellitus without complications: Secondary | ICD-10-CM | POA: Diagnosis not present

## 2024-03-28 DIAGNOSIS — E785 Hyperlipidemia, unspecified: Secondary | ICD-10-CM | POA: Diagnosis not present

## 2024-03-29 LAB — LIPID PANEL
Cholesterol: 141 mg/dL (ref <200)
HDL: 41 mg/dL (ref >=40)
LDL Cholesterol (Calc): 80 mg/dL
Non-HDL Cholesterol (Calc): 100 mg/dL (ref <130)
Total CHOL/HDL Ratio: 3.4 (calc) (ref <5.0)
Triglycerides: 103 mg/dL (ref <150)

## 2024-03-29 LAB — CBC WITH DIFFERENTIAL/PLATELET
Absolute Lymphocytes: 1691 {cells}/uL (ref 850–3900)
Absolute Monocytes: 545 {cells}/uL (ref 200–950)
Basophils Absolute: 48 {cells}/uL (ref 0–200)
Basophils Relative: 0.7 %
Eosinophils Absolute: 214 {cells}/uL (ref 15–500)
Eosinophils Relative: 3.1 %
HCT: 45.5 % (ref 38.5–50.0)
Hemoglobin: 15.3 g/dL (ref 13.2–17.1)
MCH: 32.1 pg (ref 27.0–33.0)
MCHC: 33.6 g/dL (ref 32.0–36.0)
MCV: 95.6 fL (ref 80.0–100.0)
MPV: 10.1 fL (ref 7.5–12.5)
Monocytes Relative: 7.9 %
Neutro Abs: 4402 {cells}/uL (ref 1500–7800)
Neutrophils Relative %: 63.8 %
Platelets: 228 Thousand/uL (ref 140–400)
RBC: 4.76 Million/uL (ref 4.20–5.80)
RDW: 12.2 % (ref 11.0–15.0)
Total Lymphocyte: 24.5 %
WBC: 6.9 Thousand/uL (ref 3.8–10.8)

## 2024-03-29 LAB — PSA: PSA: 0.16 ng/mL (ref <=4.00)

## 2024-03-29 LAB — COMPREHENSIVE METABOLIC PANEL WITH GFR
AG Ratio: 1.7 (calc) (ref 1.0–2.5)
ALT: 16 U/L (ref 9–46)
AST: 17 U/L (ref 10–35)
Albumin: 4.8 g/dL (ref 3.6–5.1)
Alkaline phosphatase (APISO): 58 U/L (ref 35–144)
BUN: 22 mg/dL (ref 7–25)
CO2: 31 mmol/L (ref 20–32)
Calcium: 9.9 mg/dL (ref 8.6–10.3)
Chloride: 100 mmol/L (ref 98–110)
Creat: 1.17 mg/dL (ref 0.70–1.28)
Globulin: 2.9 g/dL (ref 1.9–3.7)
Glucose, Bld: 129 mg/dL — ABNORMAL HIGH (ref 65–99)
Potassium: 4.4 mmol/L (ref 3.5–5.3)
Sodium: 139 mmol/L (ref 135–146)
Total Bilirubin: 1 mg/dL (ref 0.2–1.2)
Total Protein: 7.7 g/dL (ref 6.1–8.1)
eGFR: 66 mL/min/1.73m2 (ref >=60)

## 2024-03-29 LAB — HEMOGLOBIN A1C
Hgb A1c MFr Bld: 6.5 % — ABNORMAL HIGH (ref <5.7)
Mean Plasma Glucose: 140 mg/dL
eAG (mmol/L): 7.7 mmol/L

## 2024-03-29 LAB — MICROALBUMIN / CREATININE URINE RATIO
Creatinine, Urine: 163 mg/dL (ref 20–320)
Microalb Creat Ratio: 13 mg/g{creat} (ref <30)
Microalb, Ur: 2.1 mg/dL

## 2024-03-31 NOTE — Progress Notes (Signed)
 Annual Wellness Visit   Patient Care Team: Perri Ronal PARAS, MD as PCP - General (Internal Medicine)  Visit Date: 04/01/24   Chief Complaint  Patient presents with   Medicare Wellness   Annual Exam   Subjective:  Patient: Reginald Adkins, Male DOB: 05-31-1951, 73 y.o. MRN: [id number] Vitals:   04/01/24 0928  BP: 130/80   Reginald Adkins is a 73 y.o. Male who presents today for his Annual Wellness Visit. Patient has Hypertension; History of kidney stones; Controlled diabetes mellitus type II without complication (HCC); Office hypertension; and Anxiety and depression on their problem list.   [place] that he recently had eye exam and  purchased glasses.   History of Diabetes Mellitus, Type II treated with Glucophage  500 mg twice daily with a meal. Glucotrol  XL 5 mg with breakfast. 03/28/2024 HgbA1c at 6.5%. Says that he does eat what he wants but he does not eat frequently.   History of Hypertension treated with Norvasc  5 mg, hydrochlorothiazide  25 mg daily, Cozaar  100 mg daily. Blood pressure today is normal at 130/80.    History of Hyperlipidemia treated with Lipitor 10 mg daily. 03/26/2024 Lipid Panel normal.   History of Anxiety and depression treated with Xanax  0.25 mg three times daily as needed, Zoloft  50 mg daily. Mood stable. Says he does not take xanax  daily, he only takes it when he has situational stress.    History of Sensorineural hearing loss. He wears hearing aids.   History of left ureteral stone January 2017. History of dog bite right second and third fingers 2018. Dog was reportedly up-to-date on rabies vaccine at the time. He had tonsillectomy as a child. Multiple ear surgeries as a child.   Labs 03/28/2024 Blood glucose 129, HgbA1c 6.5, Otherwise WNL    12/01/2022 Colonoscopy Perianal skin tags found on perianal exam. Hemorrhoids found on digital rectal exam. The examined portion of the ileum was normal.  Two 4 to 6 mm polyps in the descending colon and in the  transverse colon. Resected and retrieved. Pathology found to be Tubular adenomas. Normal mucosa in the entire examined colon otherwise. Non- bleeding non- thrombosed external and internal hemorrhoids. Repeat in 3 Years.    Health maintenance: Had eye exam in September.     Diabetic foot exam normal.    Vaccine counseling: UTD on Influenza and Covid-19 vaccine.    Health Maintenance  Topic Date Due   OPHTHALMOLOGY EXAM  02/28/2018   COVID-19 Vaccine (9 - 2025-26 season) 09/02/2024   FOOT EXAM  09/17/2024   HEMOGLOBIN A1C  09/26/2024   Diabetic kidney evaluation - eGFR measurement  03/28/2025   Diabetic kidney evaluation - Urine ACR  03/28/2025   Medicare Annual Wellness (AWV)  04/01/2025   Colonoscopy  11/30/2025   DTaP/Tdap/Td (4 - Td or Tdap) 02/15/2033   Pneumococcal Vaccine: 50+ Years  Completed   Influenza Vaccine  Completed   Zoster Vaccines- Shingrix  Completed   Meningococcal B Vaccine  Aged Out   Hepatitis C Screening  Discontinued     Review of Systems  Constitutional:  Negative for fever and malaise/fatigue.  HENT:  Negative for congestion.   Eyes:  Negative for blurred vision.  Respiratory:  Negative for cough and shortness of breath.   Cardiovascular:  Negative for chest pain, palpitations and leg swelling.  Gastrointestinal:  Negative for vomiting.  Musculoskeletal:  Negative for back pain.  Skin:  Negative for rash.  Neurological:  Negative for loss of consciousness and headaches.  Objective:  Vitals: body mass index is 26.78 kg/m. Today's Vitals   04/01/24 0928  BP: 130/80  Pulse: 66  SpO2: 98%  Weight: 192 lb (87.1 kg)  Height: 5' 11 (1.803 m)  PainSc: 0-No pain   Physical Exam Vitals and nursing note reviewed. Exam conducted with a chaperone present (Araceli [place], CMA).  Constitutional:      General: He is awake. He is not in acute distress.    Appearance: Normal appearance. He is not ill-appearing or toxic-appearing.  HENT:     Head:  Normocephalic and atraumatic.     Right Ear: Tympanic membrane, ear canal and external ear normal.     Left Ear: Tympanic membrane, ear canal and external ear normal.     Ears:     Comments: Cerumen in right ear.    Mouth/Throat:     Pharynx: Oropharynx is clear.     Comments: Dentition lower mandibular area: some teeth are broken off.  Eyes:     Extraocular Movements: Extraocular movements intact.     Pupils: Pupils are equal, round, and reactive to light.  Neck:     Thyroid: No thyroid mass, thyromegaly or thyroid tenderness.     Vascular: No carotid bruit.  Cardiovascular:     Rate and Rhythm: Normal rate and regular rhythm. No extrasystoles are present.    Pulses:          Dorsalis pedis pulses are 1+ on the right side and 1+ on the left side.       Posterior tibial pulses are 2+ on the right side and 2+ on the left side.     Heart sounds: Normal heart sounds. No murmur heard.    No friction rub. No gallop.  Pulmonary:     Effort: Pulmonary effort is normal.     Breath sounds: Normal breath sounds. No decreased breath sounds, wheezing, rhonchi or rales.  Chest:     Chest wall: No mass.  Abdominal:     Palpations: Abdomen is soft. There is no hepatomegaly, splenomegaly or mass.     Tenderness: There is no abdominal tenderness.     Hernia: No hernia is present.  Genitourinary:    Prostate: Normal. Not enlarged, not tender and no nodules present.     Rectum: Normal. Guaiac result negative.  Musculoskeletal:     Cervical back: Normal range of motion.     Right lower leg: No edema.     Left lower leg: No edema.  Feet:     Comments: Positional sense is intact.  Lymphadenopathy:     Cervical: No cervical adenopathy.     Upper Body:     Right upper body: No supraclavicular adenopathy.     Left upper body: No supraclavicular adenopathy.  Skin:    General: Skin is warm and dry.  Neurological:     General: No focal deficit present.     Mental Status: He is alert and oriented  to person, place, and time. Mental status is at baseline.     Cranial Nerves: Cranial nerves 2-12 are intact.     Sensory: Sensation is intact.     Motor: Motor function is intact.     Coordination: Coordination is intact.     Gait: Gait is intact.     Deep Tendon Reflexes: Reflexes are normal and symmetric.  Psychiatric:        Attention and Perception: Attention normal.        Mood and Affect: Mood normal.  Speech: Speech normal.        Behavior: Behavior normal. Behavior is cooperative.        Thought Content: Thought content normal.        Cognition and Memory: Cognition and memory normal.        Judgment: Judgment normal.     Current Outpatient Medications  Medication Instructions   ALPRAZolam  (XANAX ) 0.25 mg, Oral, 2 times daily PRN   amLODipine  (NORVASC ) 5 mg, Oral, Daily   amLODipine  (NORVASC ) 5 mg, Oral, Daily   aspirin 81 mg   atorvastatin  (LIPITOR) 10 mg, Oral, Daily   glipiZIDE  (GLUCOTROL  XL) 5 MG 24 hr tablet TAKE 1 TABLET(5 MG) BY MOUTH DAILY WITH BREAKFAST   hydrochlorothiazide  (HYDRODIURIL ) 25 mg, Oral, Daily   losartan  (COZAAR ) 100 mg, Oral, Daily   metFORMIN  (GLUCOPHAGE ) 500 mg, Oral, 2 times daily with meals   metoprolol  tartrate (LOPRESSOR ) 25 mg, Oral, 2 times daily   sertraline  (ZOLOFT ) 50 MG tablet TAKE 1 TABLET(50 MG) BY MOUTH DAILY   Past Medical History:  Diagnosis Date   Anxiety    Depression    Diabetes mellitus without complication (HCC)    Type II w oral agents   GERD (gastroesophageal reflux disease)    Headache    hx of migraines but have kind of subsided some   Hypertension    Stone, kidney    Renal stones;  left  Mid ureteral stone   Medical/Surgical History Narrative:  Allergic/Intolerant to:  Allergies  Allergen Reactions   Morphine And Codeine     hallucinations   Amoxicillin  Itching    Past Surgical History:  Procedure Laterality Date   COLONOSCOPY     EXTRACORPOREAL SHOCK WAVE LITHOTRIPSY  early 2000s   INNER EAR  SURGERY Bilateral 06/12/1962   Multiple ear surgeries   STONE EXTRACTION WITH BASKET   early 2000's   TONSILLECTOMY      as child   Family History  Problem Relation Age of Onset   Cancer Brother        kidney cancer   Colon cancer Neg Hx    Colon polyps Neg Hx    Esophageal cancer Neg Hx    Rectal cancer Neg Hx    Stomach cancer Neg Hx    Family History Narrative: brother died at age 45 with diabetes. Death was either due to a stroke or MI. Another brother died with kidney cancer. Another brother with Parkinson's disease.   Social History Narrative  He does not smoke or consume alcohol. 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 Continues to take care of his animals. Says he needs some dental work done in the near future.   Most Recent Health Risks Assessment:   Medicare Risk at Home - 04/01/24 0944     Any stairs in or around the home? Yes    If so, are there any without handrails? No    Home free of loose throw rugs in walkways, pet beds, electrical cords, etc? Yes    Adequate lighting in your home to reduce risk of falls? Yes    Life alert? No    Use of a cane, walker or w/c? No    Grab bars in the bathroom? Yes    Shower chair or bench in shower? Yes    Elevated toilet seat or a handicapped toilet? No         Most Recent Social Determinants of Health (Including Hx of Tobacco, Alcohol, and Drug Use) SDOH  Screenings   Food Insecurity: No Food Insecurity (04/01/2024)  Housing: Low Risk  (04/01/2024)  Transportation Needs: No Transportation Needs (04/01/2024)  Utilities: Not At Risk (04/01/2024)  Alcohol Screen: Low Risk  (04/01/2024)  Depression (PHQ2-9): Low Risk  (04/01/2024)  Financial Resource Strain: Low Risk  (04/01/2024)  Physical Activity: Sufficiently Active (04/01/2024)  Social Connections: Unknown (04/01/2024)  Stress: No Stress Concern Present (04/01/2024)  Tobacco Use: Low Risk  (04/01/2024)  Health Literacy: Adequate Health Literacy  (04/01/2024)   Social History   Tobacco Use   Smoking status: Never   Smokeless tobacco: Never  Vaping Use   Vaping status: Never Used  Substance Use Topics   Alcohol use: No   Drug use: No   Most Recent Functional Status Assessment:    04/01/2024    9:41 AM  In your present state of health, do you have any difficulty performing the following activities:  Hearing? 1  Vision? 0  Difficulty concentrating or making decisions? 0  Walking or climbing stairs? 0  Dressing or bathing? 0  Doing errands, shopping? 0  Preparing Food and eating ? N  Using the Toilet? N  In the past six months, have you accidently leaked urine? N  Do you have problems with loss of bowel control? N  Managing your Medications? N  Managing your Finances? N  Housekeeping or managing your Housekeeping? N   Most Recent Fall Risk Assessment:    04/01/2024    9:41 AM  Fall Risk   Falls in the past year? 0  Number falls in past yr: 0  Injury with Fall? 0  Risk for fall due to : No Fall Risks  Follow up Falls prevention discussed;Education provided;Falls evaluation completed   Most Recent Anxiety/Depression Screenings:    04/01/2024    9:32 AM 09/18/2023    9:28 AM  PHQ 2/9 Scores  PHQ - 2 Score 0 0      04/01/2024    9:33 AM 09/18/2023    9:28 AM  GAD 7 : Generalized Anxiety Score  Nervous, Anxious, on Edge 1 0  Control/stop worrying 0 0  Worry too much - different things 0 0  Trouble relaxing 0 0  Restless 0 0  Easily annoyed or irritable 0 0  Afraid - awful might happen 0 0  Total GAD 7 Score 1 0  Anxiety Difficulty Not difficult at all Not difficult at all   Most Recent Cognitive Screening:    04/01/2024    9:47 AM  6CIT Screen  What Year? 0 points  What month? 0 points  What time? 0 points  Count back from 20 0 points  Months in reverse 0 points  Repeat phrase 0 points  Total Score 0 points   Most Recent Vision/Hearing Screenings:No results found. Results:  Studies Obtained  And Personally Reviewed By Me: Diabetic Foot Exam - Simple   Simple Foot Form Visual Inspection No deformities, no ulcerations, no other skin breakdown bilaterally: Yes Sensation Testing Intact to touch and monofilament testing bilaterally: Yes Pulse Check Posterior Tibialis and Dorsalis pulse intact bilaterally: Yes Comments     12/01/2022 Colonoscopy Perianal skin tags found on perianal exam. Hemorrhoids found on digital rectal exam. The examined portion of the ileum was normal.  Two 4 to 6 mm polyps in the descending colon and in the transverse colon. Resected and retrieved. Pathology found to be Tubular adenomas. Normal mucosa in the entire examined colon otherwise. Non- bleeding non- thrombosed external and internal hemorrhoids. Repeat  in 3 Years.   Labs:  CBC w/ Differential Lab Results  Component Value Date   WBC 6.9 03/28/2024   RBC 4.76 03/28/2024   HGB 15.3 03/28/2024   HCT 45.5 03/28/2024   PLT 228 03/28/2024   MCV 95.6 03/28/2024   MCH 32.1 03/28/2024   MCHC 33.6 03/28/2024   RDW 12.2 03/28/2024   MPV 10.1 03/28/2024   LYMPHSABS 1,428 03/02/2022   MONOABS 584 07/07/2016   BASOSABS 48 03/28/2024    Comprehensive Metabolic Panel Lab Results  Component Value Date   NA 139 03/28/2024   K 4.4 03/28/2024   CL 100 03/28/2024   CO2 31 03/28/2024   GLUCOSE 129 (H) 03/28/2024   BUN 22 03/28/2024   CREATININE 1.17 03/28/2024   CALCIUM  9.9 03/28/2024   PROT 7.7 03/28/2024   ALBUMIN 4.5 07/07/2016   AST 17 03/28/2024   ALT 16 03/28/2024   ALKPHOS 60 07/07/2016   BILITOT 1.0 03/28/2024   EGFR 66 03/28/2024   GFRNONAA 67 12/18/2019   Lipid Panel  Lab Results  Component Value Date   CHOL 141 03/28/2024   HDL 41 03/28/2024   LDLCALC 80 03/28/2024   TRIG 103 03/28/2024   A1c Lab Results  Component Value Date   HGBA1C 6.5 (H) 03/28/2024    PSA 03/18/2024 0.16  Assessment & Plan:  No orders of the defined types were placed in this encounter.  No orders of  the defined types were placed in this encounter.  Diabetes Mellitus, Type II: treated with Glucophage  500 mg twice daily with a meal. Glucotrol  XL 5 mg with breakfast. 03/28/2024 HgbA1c at 6.5%. Says that he does eat what he wants but he does not eat frequently.   Hypertension: treated with Norvasc  5 mg, hydrochlorothiazide  25 mg daily, Cozaar  100 mg daily. Blood pressure today is normal at 130/80.    Hyperlipidemia: treated with Lipitor 10 mg daily. 03/26/2024 Lipid Panel normal.   Anxiety and depression: treated with Xanax  0.25 mg three times daily as needed, Zoloft  50 mg daily. Mood stable. Says he does not take xanax  daily, he only takes it when he has situational stress.    Xanax  refilled.    Sensorineural hearing loss: treated with hearing aids.   12/01/2022 Colonoscopy Perianal skin tags found on perianal exam. Hemorrhoids found on digital rectal exam. The examined portion of the ileum was normal.  Two 4 to 6 mm polyps in the descending colon and in the transverse colon. Resected and retrieved. Pathology found to be Tubular adenomas. Normal mucosa in the entire examined colon otherwise. Non- bleeding non- thrombosed external and internal hemorrhoids. Repeat in 3 Years.    Health maintenance: Had eye exam in September.     Diabetic foot exam normal.    Vaccine counseling: UTD on Influenza and Covid-19 vaccine.     No follow-ups on file.   Annual Wellness Visit done today including the all of the following: Reviewed patient's Family Medical History Reviewed patient's SDOH and reviewed tobacco, alcohol, and drug use.  Reviewed and updated list of patient's medical providers Assessment of cognitive impairment was done Assessed patient's functional ability Established a written schedule for health screening services Health Risk Assessent Completed and Reviewed  Discussed health benefits of physical activity, and encouraged him to engage in regular exercise appropriate for his age  and condition.    I,Makayla C Reid,acting as a scribe for Ronal JINNY Hailstone, MD.,have documented all relevant documentation on the behalf of Ronal JINNY Hailstone, MD,as directed  by  Ronal JINNY Hailstone, MD while in the presence of Ronal JINNY Hailstone, MD.  I, Ronal JINNY Hailstone, MD, have reviewed all documentation for and agree with the above Annual Wellness Visit documentation.  Ronal JINNY Hailstone, MD Internal Medicine 04/01/2024

## 2024-04-01 ENCOUNTER — Ambulatory Visit: Admitting: Internal Medicine

## 2024-04-01 ENCOUNTER — Encounter: Payer: Self-pay | Admitting: Internal Medicine

## 2024-04-01 VITALS — BP 130/80 | HR 66 | Ht 71.0 in | Wt 192.0 lb

## 2024-04-01 DIAGNOSIS — I10 Essential (primary) hypertension: Secondary | ICD-10-CM

## 2024-04-01 DIAGNOSIS — Z7984 Long term (current) use of oral hypoglycemic drugs: Secondary | ICD-10-CM | POA: Diagnosis not present

## 2024-04-01 DIAGNOSIS — F418 Other specified anxiety disorders: Secondary | ICD-10-CM | POA: Diagnosis not present

## 2024-04-01 DIAGNOSIS — E785 Hyperlipidemia, unspecified: Secondary | ICD-10-CM | POA: Diagnosis not present

## 2024-04-01 DIAGNOSIS — E1169 Type 2 diabetes mellitus with other specified complication: Secondary | ICD-10-CM

## 2024-04-01 DIAGNOSIS — H903 Sensorineural hearing loss, bilateral: Secondary | ICD-10-CM

## 2024-04-01 DIAGNOSIS — F411 Generalized anxiety disorder: Secondary | ICD-10-CM

## 2024-04-01 DIAGNOSIS — E119 Type 2 diabetes mellitus without complications: Secondary | ICD-10-CM | POA: Diagnosis not present

## 2024-04-01 DIAGNOSIS — Z0000 Encounter for general adult medical examination without abnormal findings: Secondary | ICD-10-CM | POA: Diagnosis not present

## 2024-04-01 DIAGNOSIS — Z8659 Personal history of other mental and behavioral disorders: Secondary | ICD-10-CM

## 2024-04-01 MED ORDER — ALPRAZOLAM 0.25 MG PO TABS: 0.2500 mg | ORAL_TABLET | Freq: Two times a day (BID) | ORAL | 5 refills | Status: AC | PRN

## 2024-04-01 NOTE — Progress Notes (Signed)
 Subjective:   Reginald Adkins is a 73 y.o. male who presents for Medicare Annual/Subsequent preventive examination.  Visit Complete: In person  Patient Medicare AWV questionnaire was completed by the patient on 04/01/2024; I have confirmed that all information answered by patient is correct and no changes since this date.  Cardiac Risk Factors include: advanced age (>13men, >59 women);hypertension;diabetes mellitus     Objective:    Today's Vitals   04/01/24 0928  BP: 130/80  Pulse: 66  SpO2: 98%  Weight: 192 lb (87.1 kg)  Height: 5' 11 (1.803 m)  PainSc: 0-No pain   Body mass index is 26.78 kg/m.     04/01/2024    9:45 AM 03/07/2022   11:07 AM 06/25/2015    1:11 PM  Advanced Directives  Does Patient Have a Medical Advance Directive? Yes No No   Type of Advance Directive Living will;Healthcare Power of Attorney    Copy of Healthcare Power of Attorney in Chart? No - copy requested    Would patient like information on creating a medical advance directive?  No - Patient declined Yes - Transport planner given      Data saved with a previous flowsheet row definition    Current Medications (verified) Outpatient Encounter Medications as of 04/01/2024  Medication Sig   amLODipine  (NORVASC ) 5 MG tablet TAKE 1 TABLET BY MOUTH EVERY DAY   amLODipine  (NORVASC ) 5 MG tablet Take 1 tablet (5 mg total) by mouth daily.   aspirin 81 MG chewable tablet Chew 81 mg by mouth.   atorvastatin  (LIPITOR) 10 MG tablet TAKE 1 TABLET BY MOUTH EVERY DAY   glipiZIDE  (GLUCOTROL  XL) 5 MG 24 hr tablet TAKE 1 TABLET(5 MG) BY MOUTH DAILY WITH BREAKFAST   hydrochlorothiazide  (HYDRODIURIL ) 25 MG tablet TAKE 1 TABLET BY MOUTH DAILY   losartan  (COZAAR ) 100 MG tablet Take 1 tablet (100 mg total) by mouth daily.   metFORMIN  (GLUCOPHAGE ) 500 MG tablet Take 1 tablet (500 mg total) by mouth 2 (two) times daily with a meal.   metoprolol  tartrate (LOPRESSOR ) 25 MG tablet TAKE 1 TABLET BY MOUTH TWICE DAILY    sertraline  (ZOLOFT ) 50 MG tablet TAKE 1 TABLET(50 MG) BY MOUTH DAILY   ALPRAZolam  (XANAX ) 0.25 MG tablet Take 1 tablet (0.25 mg total) by mouth 2 (two) times daily as needed for anxiety.   [DISCONTINUED] doxycycline  (VIBRA -TABS) 100 MG tablet Take 1 tablet (100 mg total) by mouth 2 (two) times daily. (Patient not taking: Reported on 09/18/2023)   [DISCONTINUED] metFORMIN  (GLUCOPHAGE ) 500 MG tablet TAKE 1 TABLET(500 MG) BY MOUTH TWICE DAILY WITH A MEAL   [DISCONTINUED] NEOMYCIN -POLYMYXIN-HYDROCORTISONE (CORTISPORIN) 1 % SOLN OTIC solution Place 3 drops into the right ear 4 (four) times daily. (Patient not taking: Reported on 09/18/2023)   No facility-administered encounter medications on file as of 04/01/2024.    Allergies (verified) Morphine and codeine and Amoxicillin    History: Past Medical History:  Diagnosis Date   Anxiety    Depression    Diabetes mellitus without complication (HCC)    Type II w oral agents   GERD (gastroesophageal reflux disease)    Headache    hx of migraines but have kind of subsided some   Hypertension    Stone, kidney    Renal stones;  left  Mid ureteral stone   Past Surgical History:  Procedure Laterality Date   COLONOSCOPY     EXTRACORPOREAL SHOCK WAVE LITHOTRIPSY  early 2000s   INNER EAR SURGERY Bilateral 06/12/1962  Multiple ear surgeries   STONE EXTRACTION WITH BASKET   early 2000's   TONSILLECTOMY      as child   Family History  Problem Relation Age of Onset   Cancer Brother        kidney cancer   Colon cancer Neg Hx    Colon polyps Neg Hx    Esophageal cancer Neg Hx    Rectal cancer Neg Hx    Stomach cancer Neg Hx    Social History   Socioeconomic History   Marital status: Single    Spouse name: Not on file   Number of children: Not on file   Years of education: Not on file   Highest education level: Not on file  Occupational History   Not on file  Tobacco Use   Smoking status: Never   Smokeless tobacco: Never  Vaping Use    Vaping status: Never Used  Substance and Sexual Activity   Alcohol use: No   Drug use: No   Sexual activity: Not on file  Other Topics Concern   Not on file  Social History Narrative   Not on file   Social Drivers of Health   Financial Resource Strain: Low Risk  (04/01/2024)   Overall Financial Resource Strain (CARDIA)    Difficulty of Paying Living Expenses: Not very hard  Food Insecurity: No Food Insecurity (04/01/2024)   Hunger Vital Sign    Worried About Running Out of Food in the Last Year: Never true    Ran Out of Food in the Last Year: Never true  Transportation Needs: No Transportation Needs (04/01/2024)   PRAPARE - Administrator, Civil Service (Medical): No    Lack of Transportation (Non-Medical): No  Physical Activity: Sufficiently Active (04/01/2024)   Exercise Vital Sign    Days of Exercise per Week: 7 days    Minutes of Exercise per Session: 40 min  Stress: No Stress Concern Present (04/01/2024)   [employer] of Occupational Health - Occupational Stress Questionnaire    Feeling of Stress: Not at all  Social Connections: Unknown (04/01/2024)   Social Connection and Isolation Panel    Frequency of Communication with Friends and Family: Not on file    Frequency of Social Gatherings with Friends and Family: Not on file    Attends Religious Services: Not on file    Active Member of Clubs or Organizations: Not on file    Attends Banker Meetings: Not on file    Marital Status: Widowed    Tobacco Counseling Counseling given: No   Clinical Intake:  Pre-visit preparation completed: Yes  Pain : No/denies pain Pain Score: 0-No pain     BMI - recorded: 26.78 Nutritional Status: BMI 25 -29 Overweight Nutritional Risks: None Diabetes: Yes CBG done?: Yes CBG resulted in Enter/ Edit results?: Yes Did pt. bring in CBG monitor from home?: Yes Glucose Meter Downloaded?: No  How often do you need to have someone help you when you  read instructions, pamphlets, or other written materials from your doctor or pharmacy?: 1 - Never  Interpreter Needed?: No  Information entered by :: Kathlynn Porto, CMA   Activities of Daily Living    04/01/2024    9:41 AM  In your present state of health, do you have any difficulty performing the following activities:  Hearing? 1  Vision? 0  Difficulty concentrating or making decisions? 0  Walking or climbing stairs? 0  Dressing or bathing? 0  Doing errands,  shopping? 0  Preparing Food and eating ? N  Using the Toilet? N  In the past six months, have you accidently leaked urine? N  Do you have problems with loss of bowel control? N  Managing your Medications? N  Managing your Finances? N  Housekeeping or managing your Housekeeping? N    Patient Care Team: Perri Ronal PARAS, MD as PCP - General (Internal Medicine)  Indicate any recent Medical Services you may have received from other than [facility] providers in the past year (date may be approximate).     Assessment:   This is a routine wellness examination for Reginald Adkins.  Hearing/Vision screen No results found.   Goals Addressed   None    Depression Screen    04/01/2024    9:32 AM 09/18/2023    9:28 AM 03/15/2023    9:32 AM 03/13/2023    9:29 AM 09/05/2022   10:29 AM 03/07/2022   11:07 AM 03/07/2022   11:06 AM  PHQ 2/9 Scores  PHQ - 2 Score 0 0 0 0 0 0 0  PHQ- 9 Score   0   1     Fall Risk    04/01/2024    9:41 AM 09/18/2023    9:25 AM 03/13/2023    9:33 AM 09/05/2022   10:28 AM 03/07/2022   11:06 AM  Fall Risk   Falls in the past year? 0 0 0 0 0  Number falls in past yr: 0 0 0 0 0  Injury with Fall? 0  0 0 0  Risk for fall due to : No Fall Risks No Fall Risks No Fall Risks No Fall Risks No Fall Risks  Follow up Falls prevention discussed;Education provided;Falls evaluation completed Falls evaluation completed Falls evaluation completed Falls prevention discussed Falls evaluation completed      Data saved with a  previous flowsheet row definition    MEDICARE RISK AT HOME: Medicare Risk at Home Any stairs in or around the home?: Yes If so, are there any without handrails?: No Home free of loose throw rugs in walkways, pet beds, electrical cords, etc?: Yes Adequate lighting in your home to reduce risk of falls?: Yes Life alert?: No Use of a cane, walker or w/c?: No Grab bars in the bathroom?: Yes Shower chair or bench in shower?: Yes Elevated toilet seat or a handicapped toilet?: No  TIMED UP AND GO:  Was the test performed?  No    Cognitive Function:        04/01/2024    9:47 AM 03/13/2023    9:36 AM 03/07/2022   11:11 AM  6CIT Screen  What Year? 0 points 0 points 0 points  What month? 0 points 0 points 0 points  What time? 0 points 0 points 0 points  Count back from 20 0 points 0 points 0 points  Months in reverse 0 points 0 points 0 points  Repeat phrase 0 points 0 points 0 points  Total Score 0 points 0 points 0 points    Immunizations Immunization History  Administered Date(s) Administered   Fluad Trivalent(High Dose 65+) 02/16/2023   INFLUENZA, HIGH DOSE SEASONAL PF 02/22/2017, 03/13/2018, 02/05/2019, 02/16/2023, 03/05/2024   Influenza Split 03/07/2013   Influenza, Seasonal, Injecte, Preservative Fre 03/07/2013   Influenza,inj,Quad PF,6+ Mos 03/09/2014, 02/12/2015, 03/22/2020, 03/16/2021   Influenza-Unspecified 02/12/2015, 03/13/2016, 02/22/2017   Moderna Covid-19 Fall Seasonal Vaccine 55yrs & older 03/16/2022   Moderna Covid-19 Vaccine Bivalent Booster 63yrs & up 05/17/2021   PFIZER(Purple Top)SARS-COV-2  Vaccination 08/05/2019, 08/26/2019, 03/08/2020   Pfizer Covid-19 Vaccine Bivalent Booster 22yrs & up 02/16/2023   Pfizer(Comirnaty)Fall Seasonal Vaccine 12 years and older 02/16/2023, 03/05/2024   Pneumococcal Conjugate-13 02/22/2017   Pneumococcal Polysaccharide-23 03/13/2018   Respiratory Syncytial Virus Vaccine,Recomb Aduvanted(Arexvy) 02/10/2022   Td 02/16/2023    Tdap 04/05/2012, 10/25/2022   Unspecified SARS-COV-2 Vaccination 12/23/2020   Zoster Recombinant(Shingrix) 02/12/2015, 02/10/2022, 08/20/2023   Zoster, Live 02/12/2015    TDAP status: Up to date  Flu Vaccine status: Up to date  Pneumococcal vaccine status: Up to date  Covid-19 vaccine status: Information provided on how to obtain vaccines.   Qualifies for Shingles Vaccine? Yes   Zostavax completed Yes   Shingrix Completed?: Yes  Screening Tests Health Maintenance  Topic Date Due   OPHTHALMOLOGY EXAM  02/28/2018   COVID-19 Vaccine (9 - 2025-26 season) 09/02/2024   FOOT EXAM  09/17/2024   HEMOGLOBIN A1C  09/26/2024   Diabetic kidney evaluation - eGFR measurement  03/28/2025   Diabetic kidney evaluation - Urine ACR  03/28/2025   Medicare Annual Wellness (AWV)  04/01/2025   Colonoscopy  11/30/2025   DTaP/Tdap/Td (4 - Td or Tdap) 02/15/2033   Pneumococcal Vaccine: 50+ Years  Completed   Influenza Vaccine  Completed   Zoster Vaccines- Shingrix  Completed   Meningococcal B Vaccine  Aged Out   Hepatitis C Screening  Discontinued    Health Maintenance  Health Maintenance Due  Topic Date Due   OPHTHALMOLOGY EXAM  02/28/2018    Colorectal cancer screening: Type of screening: Colonoscopy. Completed 12/01/2022. Repeat every 3 years  Lung Cancer Screening: (Low Dose CT Chest recommended if Age 96-80 years, 20 pack-year currently smoking OR have quit w/in 15years.) does not qualify.   Additional Screening:  Hepatitis C Screening: does not qualify; Completed   Vision Screening: Recommended annual ophthalmology exams for early detection of glaucoma and other disorders of the eye. Is the patient up to date with their annual eye exam?  Yes  Who is the provider or what is the name of the office in which the patient attends annual eye exams? [facility] If pt is not established with a provider, would they like to be referred to a provider to establish care?  No .   Dental Screening: Recommended annual dental exams for proper oral hygiene  Diabetic Foot Exam: Diabetic Foot Exam: Completed 04/01/2024  Community Resource Referral / Chronic Care Management: CRR required this visit?  No   CCM required this visit?  No     Plan:     I have personally reviewed and noted the following in the patient's chart:   Medical and social history Use of alcohol, tobacco or illicit drugs  Current medications and supplements including opioid prescriptions. Patient is not currently taking opioid prescriptions. Functional ability and status Nutritional status Physical activity Advanced directives List of other physicians Hospitalizations, surgeries, and ER visits in previous 12 months Vitals Screenings to include cognitive, depression, and falls Referrals and appointments  In addition, I have reviewed and discussed with patient certain preventive protocols, quality metrics, and best practice recommendations. A written personalized care plan for preventive services as well as general preventive health recommendations were provided to patient.     Branae Crail Zelda, CMA   04/01/2024   After Visit Summary: (In Person-Printed) AVS printed and given to the patient  I, Ronal JINNY Hailstone, MD, have reviewed all documentation for this visit. The documentation on 04/01/2024 for the exam, diagnosis, procedures, and  orders are all accurate and complete.

## 2024-04-01 NOTE — Patient Instructions (Signed)
Next appointment: Follow up in one year for your annual wellness visit.  Preventive Care 2 Years and Older, Male Preventive care refers to lifestyle choices and visits with your health care provider that can promote health and wellness. What does preventive care include? A yearly physical exam. This is also called an annual well check. Dental exams once or twice a year. Routine eye exams. Ask your health care provider how often you should have your eyes checked. Personal lifestyle choices, including: Daily care of your teeth and gums. Regular physical activity. Eating a healthy diet. Avoiding tobacco and drug use. Limiting alcohol use. Practicing safe sex. Taking low doses of aspirin every day. Taking vitamin and mineral supplements as recommended by your health care provider. What happens during an annual well check? The services and screenings done by your health care provider during your annual well check will depend on your age, overall health, lifestyle risk factors, and family history of disease. Counseling  Your health care provider may ask you questions about your: Alcohol use. Tobacco use. Drug use. Emotional well-being. Home and relationship well-being. Sexual activity. Eating habits. History of falls. Memory and ability to understand (cognition). Work and work Astronomer. Screening  You may have the following tests or measurements: Height, weight, and BMI. Blood pressure. Lipid and cholesterol levels. These may be checked every 5 years, or more frequently if you are over 48 years old. Skin check. Lung cancer screening. You may have this screening every year starting at age 16 if you have a 30-pack-year history of smoking and currently smoke or have quit within the past 15 years. Fecal occult blood test (FOBT) of the stool. You may have this test every year starting at age 13. Flexible sigmoidoscopy or colonoscopy. You may have a sigmoidoscopy every 5 years or a  colonoscopy every 10 years starting at age 54. Prostate cancer screening. Recommendations will vary depending on your family history and other risks. Hepatitis C blood test. Hepatitis B blood test. Sexually transmitted disease (STD) testing. Diabetes screening. This is done by checking your blood sugar (glucose) after you have not eaten for a while (fasting). You may have this done every 1-3 years. Abdominal aortic aneurysm (AAA) screening. You may need this if you are a current or former smoker. Osteoporosis. You may be screened starting at age 64 if you are at high risk. Talk with your health care provider about your test results, treatment options, and if necessary, the need for more tests. Vaccines  Your health care provider may recommend certain vaccines, such as: Influenza vaccine. This is recommended every year. Tetanus, diphtheria, and acellular pertussis (Tdap, Td) vaccine. You may need a Td booster every 10 years. Zoster vaccine. You may need this after age 10. Pneumococcal 13-valent conjugate (PCV13) vaccine. One dose is recommended after age 63. Pneumococcal polysaccharide (PPSV23) vaccine. One dose is recommended after age 65. Talk to your health care provider about which screenings and vaccines you need and how often you need them. This information is not intended to replace advice given to you by your health care provider. Make sure you discuss any questions you have with your health care provider. Document Released: 06/25/2015 Document Revised: 02/16/2016 Document Reviewed: 03/30/2015 Elsevier Interactive Patient Education  2017 [employer].  Fall Prevention in the Home Falls can cause injuries. They can happen to people of all ages. There are many things you can do to make your home safe and to help prevent falls. What can I do on  the outside of my home? Regularly fix the edges of walkways and driveways and fix any cracks. Remove anything that might make you trip as you  walk through a door, such as a raised step or threshold. Trim any bushes or trees on the path to your home. Use bright outdoor lighting. Clear any walking paths of anything that might make someone trip, such as rocks or tools. Regularly check to see if handrails are loose or broken. Make sure that both sides of any steps have handrails. Any raised decks and porches should have guardrails on the edges. Have any leaves, snow, or ice cleared regularly. Use sand or salt on walking paths during winter. Clean up any spills in your garage right away. This includes oil or grease spills. What can I do in the bathroom? Use night lights. Install grab bars by the toilet and in the tub and shower. Do not use towel bars as grab bars. Use non-skid mats or decals in the tub or shower. If you need to sit down in the shower, use a plastic, non-slip stool. Keep the floor dry. Clean up any water that spills on the floor as soon as it happens. Remove soap buildup in the tub or shower regularly. Attach bath mats securely with double-sided non-slip rug tape. Do not have throw rugs and other things on the floor that can make you trip. What can I do in the bedroom? Use night lights. Make sure that you have a light by your bed that is easy to reach. Do not use any sheets or blankets that are too big for your bed. They should not hang down onto the floor. Have a firm chair that has side arms. You can use this for support while you get dressed. Do not have throw rugs and other things on the floor that can make you trip. What can I do in the kitchen? Clean up any spills right away. Avoid walking on wet floors. Keep items that you use a lot in easy-to-reach places. If you need to reach something above you, use a strong step stool that has a grab bar. Keep electrical cords out of the way. Do not use floor polish or wax that makes floors slippery. If you must use wax, use non-skid floor wax. Do not have throw rugs  and other things on the floor that can make you trip. What can I do with my stairs? Do not leave any items on the stairs. Make sure that there are handrails on both sides of the stairs and use them. Fix handrails that are broken or loose. Make sure that handrails are as long as the stairways. Check any carpeting to make sure that it is firmly attached to the stairs. Fix any carpet that is loose or worn. Avoid having throw rugs at the top or bottom of the stairs. If you do have throw rugs, attach them to the floor with carpet tape. Make sure that you have a light switch at the top of the stairs and the bottom of the stairs. If you do not have them, ask someone to add them for you. What else can I do to help prevent falls? Wear shoes that: Do not have high heels. Have rubber bottoms. Are comfortable and fit you well. Are closed at the toe. Do not wear sandals. If you use a stepladder: Make sure that it is fully opened. Do not climb a closed stepladder. Make sure that both sides of the stepladder are locked  into place. Ask someone to hold it for you, if possible. Clearly mark and make sure that you can see: Any grab bars or handrails. First and last steps. Where the edge of each step is. Use tools that help you move around (mobility aids) if they are needed. These include: Canes. Walkers. Scooters. Crutches. Turn on the lights when you go into a dark area. Replace any light bulbs as soon as they burn out. Set up your furniture so you have a clear path. Avoid moving your furniture around. If any of your floors are uneven, fix them. If there are any pets around you, be aware of where they are. Review your medicines with your doctor. Some medicines can make you feel dizzy. This can increase your chance of falling. Ask your doctor what other things that you can do to help prevent falls. This information is not intended to replace advice given to you by your health care provider. Make sure  you discuss any questions you have with your health care provider. Document Released: 03/25/2009 Document Revised: 11/04/2015 Document Reviewed: 07/03/2014 Elsevier Interactive Patient Education  2017 [employer].

## 2024-04-02 ENCOUNTER — Encounter: Payer: Self-pay | Admitting: Internal Medicine

## 2024-04-02 ENCOUNTER — Other Ambulatory Visit: Payer: Self-pay | Admitting: Internal Medicine

## 2024-05-04 ENCOUNTER — Other Ambulatory Visit: Payer: Self-pay | Admitting: Internal Medicine

## 2024-06-30 ENCOUNTER — Other Ambulatory Visit: Payer: Self-pay | Admitting: Internal Medicine

## 2024-10-03 ENCOUNTER — Other Ambulatory Visit: Payer: Self-pay

## 2024-10-06 ENCOUNTER — Ambulatory Visit: Payer: Self-pay | Admitting: Internal Medicine
